# Patient Record
Sex: Female | Born: 1950 | Race: Black or African American | Hispanic: No | Marital: Married | Smoking: Former smoker
Health system: Southern US, Community
[De-identification: ages and names within clinical notes are randomized; demographics above are authoritative.]

## PROBLEM LIST (undated history)

## (undated) DIAGNOSIS — I10 Essential (primary) hypertension: Secondary | ICD-10-CM

## (undated) DIAGNOSIS — M1990 Unspecified osteoarthritis, unspecified site: Secondary | ICD-10-CM

## (undated) DIAGNOSIS — F32A Depression, unspecified: Secondary | ICD-10-CM

## (undated) DIAGNOSIS — E785 Hyperlipidemia, unspecified: Secondary | ICD-10-CM

## (undated) DIAGNOSIS — F419 Anxiety disorder, unspecified: Secondary | ICD-10-CM

## (undated) DIAGNOSIS — F329 Major depressive disorder, single episode, unspecified: Secondary | ICD-10-CM

## (undated) DIAGNOSIS — K219 Gastro-esophageal reflux disease without esophagitis: Secondary | ICD-10-CM

## (undated) DIAGNOSIS — E119 Type 2 diabetes mellitus without complications: Secondary | ICD-10-CM

## (undated) HISTORY — PX: OTHER SURGICAL HISTORY: SHX169

## (undated) HISTORY — PX: BREAST SURGERY: SHX581

## (undated) HISTORY — DX: Gastro-esophageal reflux disease without esophagitis: K21.9

## (undated) HISTORY — DX: Type 2 diabetes mellitus without complications: E11.9

## (undated) HISTORY — DX: Hyperlipidemia, unspecified: E78.5

## (undated) HISTORY — DX: Essential (primary) hypertension: I10

## (undated) HISTORY — PX: BILATERAL CARPAL TUNNEL RELEASE: SHX6508

## (undated) HISTORY — PX: JOINT REPLACEMENT: SHX530

---

## 2008-07-10 HISTORY — PX: CARDIAC CATHETERIZATION: SHX172

## 2008-10-15 ENCOUNTER — Ambulatory Visit: Payer: Self-pay | Admitting: Cardiology

## 2008-10-19 ENCOUNTER — Ambulatory Visit: Payer: Self-pay | Admitting: Cardiology

## 2008-11-06 ENCOUNTER — Inpatient Hospital Stay (HOSPITAL_BASED_OUTPATIENT_CLINIC_OR_DEPARTMENT_OTHER): Admission: RE | Admit: 2008-11-06 | Discharge: 2008-11-06 | Payer: Self-pay | Admitting: Cardiology

## 2008-11-06 ENCOUNTER — Ambulatory Visit: Payer: Self-pay | Admitting: Cardiology

## 2008-11-27 ENCOUNTER — Encounter (INDEPENDENT_AMBULATORY_CARE_PROVIDER_SITE_OTHER): Payer: Self-pay | Admitting: *Deleted

## 2009-04-06 DIAGNOSIS — R9439 Abnormal result of other cardiovascular function study: Secondary | ICD-10-CM | POA: Insufficient documentation

## 2009-04-06 DIAGNOSIS — I10 Essential (primary) hypertension: Secondary | ICD-10-CM | POA: Insufficient documentation

## 2009-04-06 DIAGNOSIS — E7800 Pure hypercholesterolemia, unspecified: Secondary | ICD-10-CM | POA: Insufficient documentation

## 2010-06-20 ENCOUNTER — Ambulatory Visit: Payer: Self-pay | Admitting: Internal Medicine

## 2010-11-22 NOTE — Assessment & Plan Note (Signed)
[facility]                          [facility] OFFICE NOTE   ELTA, ANGELL                   MRN:          [id number]  DATE:10/19/2008                            DOB:          [birth year]    Ms. Graefe has diabetes and hypertension.  She was referred for a  stress Cardiolite scan.  This was done at [facility] on October 15, 2008.  I  personally read that study.  The patient had decreased activity in the  mid anterior wall and the distal anterior wall with stress.  It appeared  to reverse.  There was some motion with the stress study.  The patient  has breast attenuation.  There was a definite anterior defect.  It is  compatible with ischemia.  It is possible that it could be a false-  positive related to breast attenuation and of motion.  However, I cannot  rule out moderate anterior ischemia from the study.   After seeing this result we were in touch with the patient and Dr.  Pauletta Browns office to arrange for early follow-up and she is seen today in  the office.  As mentioned, she does have diabetes.  She does have a  smothering sensation at times, but it is not necessarily related to  exertion.  She has had some palpitations.  There is no syncope or  presyncope.  There is a family history for coronary disease.  As  mentioned, she does have hypertension and diabetes.  She does not smoke.  She is overweight.   PAST MEDICAL HISTORY:   ALLERGIES:  PENICILLIN.   MEDICATIONS:  1. Avandamet.  2. Lovastatin.  3. Nabumetone.  4. Diovan/hydrochlorothiazide.  5. Aspirin.  6. Calcium.  7. Zinc.  8. Fish oil.  9. Magnesium.  10.Other vitamins.   OTHER MEDICAL PROBLEMS:  See the list below.   SOCIAL HISTORY:  The patient is married and she works at home.  She has  never smoked.   FAMILY HISTORY:  There is a strong family history of coronary disease.   REVIEW OF SYSTEMS:  The patient has no fevers or chills.  She has no  skin rashes.   There are no headaches.  There is no change in her vision  or hearing.  She has no cough.  There is rare shooting chest pain.  She  has no GI or GU symptoms.  There is no swelling.  She has no  musculoskeletal complaints.  All other systems are reviewed and are  negative.   PHYSICAL EXAMINATION:  Blood pressure today is 138/77, with a pulse of  74.  The patient is oriented to person, time and place.  Affect is normal.  HEENT:  Reveals no xanthelasma.  She has normal extraocular motion.  There are no carotid bruits.  There is no jugular venous distention.  LUNGS:  Clear.  Respiratory effort is not labored.  CARDIAC EXAM:  Reveals an S1 with an S2.  There are no clicks or  significant murmurs.  ABDOMEN:  Soft.  She has no significant peripheral edema.  There are no musculoskeletal deformities.  The patient  is overweight.   PROBLEMS INCLUDE:  1. History of bilateral carpal tunnel.  2. Status post left knee replacement.  3. Arthritis.  4. History of lumbar disk disease.  5. Hypertension.  6. Hypercholesterolemia.  7. Diabetes.  8. Abnormal stress Cardiolite scan.   The patient does have multiple risk factors for coronary disease.  I am  worried about the possibility of moderate anterior ischemia in this  diabetic patient.  I have had a long discussion with the patient and her  husband in person.  I believe that proceeding with cardiac  catheterization will be the most appropriate approach.  They are  considering this.  She is on aspirin at this time.  She needs to  continue on her cholesterol medication and of course try to lose weight.  The patient and her husband are in the process of deciding if they are  willing to proceed with catheterization.     Luis Abed, MD, [facility]  Electronically Signed    JDK/MedQ  DD: 10/19/2008  DT: 10/19/2008  Job #: [id number]   cc:   Ernestine Conrad, MD

## 2010-11-22 NOTE — Cardiovascular Report (Signed)
NAMEJIMMA, Kristen Gallagher NO.:  [id number]   MEDICAL RECORD NO.:  [id number]          PATIENT TYPE:  OIB   LOCATION:  1963                         FACILITY:  [facility]   PHYSICIAN:  Rollene Rotunda, MD, FACCDATE OF BIRTH:  02/22/1951   DATE OF PROCEDURE:  11/06/2008  DATE OF DISCHARGE:                            CARDIAC CATHETERIZATION   PRIMARY CARE PHYSICIAN:  Ernestine Conrad, MD   CARDIOLOGIST:  Luis Abed, MD, [facility].   PROCEDURE:  Left heart catheterization/coronary arteriography.   INDICATIONS:  Evaluate the patient with chest pain and a stress  perfusion study suggesting anterior apical ischemia.   PROCEDURE NOTE:  Left heart catheterization performed via the right  femoral artery.  The artery was cannulated using wall puncture.  A #4  French arterial sheath was inserted via the modified Seldinger  technique.  Preformed Judkins and a pigtail catheter were utilized.  The  patient tolerated procedure well and left the lab in stable condition.   RESULTS:  Hemodynamics:  LV 131/19, AO 139/62.  Coronaries:  Left main is normal.  The LAD was had proximal long  calcification.  There was long proximal 25% stenosis.  First through  third diagonals were small and normal.  The circumflex in the AV groove  was normal.  There was a first obtuse marginal, which was large and  normal.  A posterolateral which was large and normal.  The right  coronary artery is a dominant vessel.  It was large.  There is long  proximal 25% stenosis.  The PDA was large and normal.  Left ventriculogram:  The left ventriculogram was obtained in the RAO  projection.  The EF was 65% with normal wall motion.   CONCLUSION:  Nonobstructive coronary plaque.  Well-preserved ejection  fraction.   PLAN:  The patient will have continued medical management risk  reduction.      Rollene Rotunda, MD, [facility]  Electronically Signed     JH/MEDQ  D:  11/06/2008  T:  11/06/2008  Job:  [id number]   cc:    Luis Abed, MD, [facility]  Ernestine Conrad, MD

## 2013-01-23 DIAGNOSIS — R0602 Shortness of breath: Secondary | ICD-10-CM

## 2014-09-09 ENCOUNTER — Other Ambulatory Visit: Payer: Self-pay | Admitting: Family Medicine

## 2014-09-09 DIAGNOSIS — R921 Mammographic calcification found on diagnostic imaging of breast: Secondary | ICD-10-CM

## 2014-10-02 ENCOUNTER — Telehealth: Payer: Self-pay | Admitting: Cardiology

## 2014-10-02 ENCOUNTER — Other Ambulatory Visit: Payer: Self-pay | Admitting: *Deleted

## 2014-10-02 ENCOUNTER — Encounter: Payer: Self-pay | Admitting: *Deleted

## 2014-10-02 ENCOUNTER — Ambulatory Visit (INDEPENDENT_AMBULATORY_CARE_PROVIDER_SITE_OTHER): Payer: Medicare Other | Admitting: Cardiology

## 2014-10-02 VITALS — BP 122/72 | HR 67 | Ht 65.0 in | Wt 181.0 lb

## 2014-10-02 DIAGNOSIS — R079 Chest pain, unspecified: Secondary | ICD-10-CM

## 2014-10-02 DIAGNOSIS — I10 Essential (primary) hypertension: Secondary | ICD-10-CM

## 2014-10-02 NOTE — Telephone Encounter (Signed)
[facility] at 10am

## 2014-10-02 NOTE — Telephone Encounter (Signed)
No precert required for GXT °

## 2014-10-02 NOTE — Patient Instructions (Signed)
Your physician recommends that you schedule a follow-up appointment TO BE DETERMINED AFTER STRESS TEST  Your physician has requested that you have an exercise tolerance test. For further information please visit [email or web address]. Please also follow instruction sheet, as given.  Your physician recommends that you continue on your current medications as directed. Please refer to the Current Medication list given to you today.  Thank you for choosing [facility]!!

## 2014-10-02 NOTE — Progress Notes (Signed)
Clinical Summary Kristen Gallagher is a 64 y.o.female seen today as a new patient for chest pain  1. Chest pain - MPI in 2010 that was abnormal with anterior defect. Sent for cath, showed non-obstructive disease - notes some exertional chest pain recently. Heaviness in chest, fairly mild 1/10. Can walk 3 miles daily. Most comfortable walking on level ground, typically can have symptoms going up hill. Can have similar symptoms with anxiety. Last episode 1 week ago.   CAD risk factors: DM2, HTN, HL, brother MI mid 86s    Past Medical History  Diagnosis Date  . Type 2 diabetes mellitus   . Hypertension   . Hyperlipidemia      Allergies  Allergen Reactions  . Sulfa Antibiotics   . Amitiza [Lubiprostone] Nausea Only  . Azithromycin Rash  . Invokana [Canagliflozin] Other (See Comments)    Yeast infection/concern for bladder CA  . Penicillins Itching  . Prednisone Other (See Comments)    ELEVATE SUGAR LEVELS     Current Outpatient Prescriptions  Medication Sig Dispense Refill  . fluticasone (FLONASE) 50 MCG/ACT nasal spray Place 2 sprays into both nostrils daily.    Marland Kitchen glimepiride (AMARYL) 1 MG tablet Take 1 tablet by mouth daily.    . metFORMIN (GLUCOPHAGE-XR) 500 MG 24 hr tablet Take 3 tablets by mouth daily.    . sertraline (ZOLOFT) 25 MG tablet Take 1 tablet by mouth daily.  5   No current facility-administered medications for this visit.     Past Surgical History  Procedure Laterality Date  . Cardiac catheterization  2010     Allergies  Allergen Reactions  . Sulfa Antibiotics   . Amitiza [Lubiprostone] Nausea Only  . Azithromycin Rash  . Invokana [Canagliflozin] Other (See Comments)    Yeast infection/concern for bladder CA  . Penicillins Itching  . Prednisone Other (See Comments)    ELEVATE SUGAR LEVELS      Family History  Problem Relation Age of Onset  . Other      family history unknown in father  . Other      family history unknown in mother      Social History Ms. Delagarza has no tobacco history on file. Ms. Tidd has no alcohol history on file.   Review of Systems CONSTITUTIONAL: No weight loss, fever, chills, weakness or fatigue.  HEENT: Eyes: No visual loss, blurred vision, double vision or yellow sclerae.No hearing loss, sneezing, congestion, runny nose or sore throat.  SKIN: No rash or itching.  CARDIOVASCULAR: per HPI RESPIRATORY: No shortness of breath, cough or sputum.  GASTROINTESTINAL: No anorexia, nausea, vomiting or diarrhea. No abdominal pain or blood.  GENITOURINARY: No burning on urination, no polyuria NEUROLOGICAL: No headache, dizziness, syncope, paralysis, ataxia, numbness or tingling in the extremities. No change in bowel or bladder control.  MUSCULOSKELETAL: No muscle, back pain, joint pain or stiffness.  LYMPHATICS: No enlarged nodes. No history of splenectomy.  PSYCHIATRIC: No history of depression or anxiety.  ENDOCRINOLOGIC: No reports of sweating, cold or heat intolerance. No polyuria or polydipsia.  Marland Kitchen   Physical Examination p 67 bp 122/72 Wt 181 lbs BMI 30 Gen: resting comfortably, no acute distress HEENT: no scleral icterus, pupils equal round and reactive, no palptable cervical adenopathy,  CV: RRR, no m/r/g, no JVD, no carotid bruits Resp: Clear to auscultation bilaterally GI: abdomen is soft, non-tender, non-distended, normal bowel sounds, no hepatosplenomegaly MSK: extremities are warm, no edema.  Skin: warm, no rash Neuro:  no  focal deficits Psych: appropriate affect   Diagnostic Studies 10/2008 cath RESULTS: Hemodynamics: LV 131/19, AO 139/62. Coronaries: Left main is normal. The LAD was had proximal long calcification. There was long proximal 25% stenosis. First through third diagonals were small and normal. The circumflex in the AV groove was normal. There was a first obtuse marginal, which was large and normal. A posterolateral which was large and normal.  The right coronary artery is a dominant vessel. It was large. There is long proximal 25% stenosis. The PDA was large and normal. Left ventriculogram: The left ventriculogram was obtained in the RAO projection. The EF was 65% with normal wall motion.  CONCLUSION: Nonobstructive coronary plaque. Well-preserved ejection fraction.  PLAN: The patient will have continued medical management risk reduction.    Assessment and Plan  1. Chest pain - unclear etiology. Symptoms are mild in character however are primarily exertional. She has multiple CAD risk factors including DM2. Obtain GXT to further evaluate and risk strategy    F/u pending stress test results   Antoine PocheJonathan F. Magon Croson, M.D.

## 2014-10-09 ENCOUNTER — Ambulatory Visit
Admission: RE | Admit: 2014-10-09 | Discharge: 2014-10-09 | Disposition: A | Discharge status: Home / Self Care | Payer: Medicare Other | Source: Ambulatory Visit | Attending: Family Medicine | Admitting: Family Medicine

## 2014-10-09 DIAGNOSIS — R921 Mammographic calcification found on diagnostic imaging of breast: Secondary | ICD-10-CM

## 2014-10-16 ENCOUNTER — Ambulatory Visit (HOSPITAL_COMMUNITY)
Admission: RE | Admit: 2014-10-16 | Discharge: 2014-10-16 | Disposition: A | Discharge status: Home / Self Care | Payer: Medicare Other | Source: Ambulatory Visit | Attending: Cardiology | Admitting: Cardiology

## 2014-10-16 ENCOUNTER — Encounter (HOSPITAL_COMMUNITY): Payer: Self-pay

## 2014-10-16 DIAGNOSIS — R079 Chest pain, unspecified: Secondary | ICD-10-CM | POA: Diagnosis not present

## 2014-10-16 NOTE — Progress Notes (Addendum)
Stress Lab Nurses Notes - Kristen Gallagher  Kristen Gallagher 10/16/2014 Reason for doing test: Chest Pain Type of test: Regular GTX Nurse performing test: Kristen PoissonPhyllis Billingsly, RN Nuclear Medicine Tech: Not Applicable Echo Tech: Not Applicable MD performing test: Koneswaran/K.Lyman BishopLawrence NP Family MD: Bluth Test explained and consent signed: Yes.   IV started: No IV started Symptoms: Fatigue in legs  Treatment/Intervention: None Reason test stopped: fatigue After recovery IV was: NA Patient to return to Nuc. Med at : NA Patient discharged: Home Patient's Condition upon discharge was: stable Comments: During test peak BP 174/52 & HR 141.  Recovery BP 142/72 & HR 85.  Symptoms resolved in recovery. Kristen Gallagher, Kristen Gallagher   Patient exercised according to the Bruce protocol for 4 min 54 seconds achieving 7.00METs. Resting heart rate increased from 69 bpm to 141 bpm (90% of THR), and resting blood pressure increased from 141/73 to 174/52. The test was stopped due to fatigue, the patient did not experience any chest pain. Baseline EKG showed NSR. Stress EKG interpretation is affected by artifact. No evidence ischemic changes and no significant arrhytmias.   1. Negative exercise stress test for ischemia 2. Duke treadmill score of 5, consistent with low risk for major cardiac events 3. Good exercise functional capacity (110% of predicted based on age and gender).   Kristen RichJonathan Sweta Halseth MD

## 2014-10-20 ENCOUNTER — Telehealth: Payer: Self-pay | Admitting: *Deleted

## 2014-10-20 NOTE — Telephone Encounter (Signed)
Pt made aware of results of GXT. Forwarded to Dr. Loney HeringBluth. Pt stated she has appt with Dr. Loney HeringBluth in 3 months

## 2014-10-29 ENCOUNTER — Other Ambulatory Visit: Payer: Self-pay | Admitting: General Surgery

## 2014-10-29 DIAGNOSIS — N6091 Unspecified benign mammary dysplasia of right breast: Secondary | ICD-10-CM

## 2014-11-03 ENCOUNTER — Other Ambulatory Visit: Payer: Self-pay | Admitting: General Surgery

## 2014-11-03 DIAGNOSIS — N6091 Unspecified benign mammary dysplasia of right breast: Secondary | ICD-10-CM

## 2014-11-18 ENCOUNTER — Ambulatory Visit
Admission: RE | Admit: 2014-11-18 | Discharge: 2014-11-18 | Disposition: A | Discharge status: Home / Self Care | Payer: Medicare Other | Source: Ambulatory Visit | Attending: General Surgery | Admitting: General Surgery

## 2014-11-18 ENCOUNTER — Encounter (HOSPITAL_BASED_OUTPATIENT_CLINIC_OR_DEPARTMENT_OTHER): Payer: Self-pay | Admitting: *Deleted

## 2014-11-18 ENCOUNTER — Encounter (HOSPITAL_BASED_OUTPATIENT_CLINIC_OR_DEPARTMENT_OTHER)
Admission: RE | Admit: 2014-11-18 | Discharge: 2014-11-18 | Disposition: A | Discharge status: Home / Self Care | Payer: Medicare Other | Source: Ambulatory Visit

## 2014-11-18 DIAGNOSIS — I10 Essential (primary) hypertension: Secondary | ICD-10-CM | POA: Diagnosis not present

## 2014-11-18 DIAGNOSIS — N6091 Unspecified benign mammary dysplasia of right breast: Secondary | ICD-10-CM | POA: Diagnosis present

## 2014-11-18 DIAGNOSIS — Z87891 Personal history of nicotine dependence: Secondary | ICD-10-CM | POA: Diagnosis not present

## 2014-11-18 DIAGNOSIS — Z7982 Long term (current) use of aspirin: Secondary | ICD-10-CM | POA: Diagnosis not present

## 2014-11-18 DIAGNOSIS — Z881 Allergy status to other antibiotic agents status: Secondary | ICD-10-CM | POA: Diagnosis not present

## 2014-11-18 DIAGNOSIS — F329 Major depressive disorder, single episode, unspecified: Secondary | ICD-10-CM | POA: Diagnosis not present

## 2014-11-18 DIAGNOSIS — E785 Hyperlipidemia, unspecified: Secondary | ICD-10-CM | POA: Diagnosis not present

## 2014-11-18 DIAGNOSIS — E780 Pure hypercholesterolemia: Secondary | ICD-10-CM | POA: Diagnosis not present

## 2014-11-18 DIAGNOSIS — Z880 Allergy status to penicillin: Secondary | ICD-10-CM | POA: Diagnosis not present

## 2014-11-18 DIAGNOSIS — Z9861 Coronary angioplasty status: Secondary | ICD-10-CM | POA: Diagnosis not present

## 2014-11-18 DIAGNOSIS — Z882 Allergy status to sulfonamides status: Secondary | ICD-10-CM | POA: Diagnosis not present

## 2014-11-18 DIAGNOSIS — M1990 Unspecified osteoarthritis, unspecified site: Secondary | ICD-10-CM | POA: Diagnosis not present

## 2014-11-18 DIAGNOSIS — F419 Anxiety disorder, unspecified: Secondary | ICD-10-CM | POA: Diagnosis not present

## 2014-11-18 DIAGNOSIS — Z888 Allergy status to other drugs, medicaments and biological substances status: Secondary | ICD-10-CM | POA: Diagnosis not present

## 2014-11-18 DIAGNOSIS — E119 Type 2 diabetes mellitus without complications: Secondary | ICD-10-CM | POA: Diagnosis not present

## 2014-11-18 LAB — BASIC METABOLIC PANEL
Anion gap: 8 (ref 5–15)
BUN: 7 mg/dL (ref 6–20)
CHLORIDE: 98 mmol/L — AB (ref 101–111)
CO2: 28 mmol/L (ref 22–32)
Calcium: 9.7 mg/dL (ref 8.9–10.3)
Creatinine, Ser: 0.68 mg/dL (ref 0.44–1.00)
GFR calc Af Amer: >60 mL/min (ref >60)
GFR calc non Af Amer: >60 mL/min (ref >60)
Glucose, Bld: 85 mg/dL (ref 70–99)
Potassium: 4.3 mmol/L (ref 3.5–5.1)
SODIUM: 134 mmol/L — AB (ref 135–145)

## 2014-11-18 NOTE — Progress Notes (Signed)
[facility] so coming for BM[facility] before procedure today. Bring all medications.

## 2014-11-20 ENCOUNTER — Ambulatory Visit (HOSPITAL_BASED_OUTPATIENT_CLINIC_OR_DEPARTMENT_OTHER): Payer: Medicare Other | Admitting: Certified Registered"

## 2014-11-20 ENCOUNTER — Ambulatory Visit (HOSPITAL_BASED_OUTPATIENT_CLINIC_OR_DEPARTMENT_OTHER)
Admission: RE | Admit: 2014-11-20 | Discharge: 2014-11-20 | Disposition: A | Discharge status: Home / Self Care | Payer: Medicare Other | Source: Ambulatory Visit | Attending: General Surgery | Admitting: General Surgery

## 2014-11-20 ENCOUNTER — Encounter (HOSPITAL_BASED_OUTPATIENT_CLINIC_OR_DEPARTMENT_OTHER)
Admission: RE | Disposition: A | Discharge status: Home / Self Care | Payer: Self-pay | Source: Ambulatory Visit | Attending: General Surgery

## 2014-11-20 ENCOUNTER — Encounter (HOSPITAL_BASED_OUTPATIENT_CLINIC_OR_DEPARTMENT_OTHER): Payer: Self-pay | Admitting: *Deleted

## 2014-11-20 ENCOUNTER — Ambulatory Visit
Admission: RE | Admit: 2014-11-20 | Discharge: 2014-11-20 | Disposition: A | Discharge status: Home / Self Care | Payer: Medicare Other | Source: Ambulatory Visit | Attending: General Surgery | Admitting: General Surgery

## 2014-11-20 DIAGNOSIS — Z9861 Coronary angioplasty status: Secondary | ICD-10-CM | POA: Insufficient documentation

## 2014-11-20 DIAGNOSIS — F329 Major depressive disorder, single episode, unspecified: Secondary | ICD-10-CM | POA: Diagnosis not present

## 2014-11-20 DIAGNOSIS — I10 Essential (primary) hypertension: Secondary | ICD-10-CM | POA: Diagnosis not present

## 2014-11-20 DIAGNOSIS — M1990 Unspecified osteoarthritis, unspecified site: Secondary | ICD-10-CM | POA: Insufficient documentation

## 2014-11-20 DIAGNOSIS — E119 Type 2 diabetes mellitus without complications: Secondary | ICD-10-CM | POA: Insufficient documentation

## 2014-11-20 DIAGNOSIS — E780 Pure hypercholesterolemia: Secondary | ICD-10-CM | POA: Insufficient documentation

## 2014-11-20 DIAGNOSIS — Z881 Allergy status to other antibiotic agents status: Secondary | ICD-10-CM | POA: Insufficient documentation

## 2014-11-20 DIAGNOSIS — Z7982 Long term (current) use of aspirin: Secondary | ICD-10-CM | POA: Insufficient documentation

## 2014-11-20 DIAGNOSIS — N6091 Unspecified benign mammary dysplasia of right breast: Secondary | ICD-10-CM | POA: Diagnosis not present

## 2014-11-20 DIAGNOSIS — F419 Anxiety disorder, unspecified: Secondary | ICD-10-CM | POA: Diagnosis not present

## 2014-11-20 DIAGNOSIS — Z880 Allergy status to penicillin: Secondary | ICD-10-CM | POA: Insufficient documentation

## 2014-11-20 DIAGNOSIS — E785 Hyperlipidemia, unspecified: Secondary | ICD-10-CM | POA: Insufficient documentation

## 2014-11-20 DIAGNOSIS — Z87891 Personal history of nicotine dependence: Secondary | ICD-10-CM | POA: Insufficient documentation

## 2014-11-20 DIAGNOSIS — Z888 Allergy status to other drugs, medicaments and biological substances status: Secondary | ICD-10-CM | POA: Insufficient documentation

## 2014-11-20 DIAGNOSIS — Z882 Allergy status to sulfonamides status: Secondary | ICD-10-CM | POA: Insufficient documentation

## 2014-11-20 HISTORY — DX: Anxiety disorder, unspecified: F41.9

## 2014-11-20 HISTORY — PX: BREAST LUMPECTOMY WITH RADIOACTIVE SEED LOCALIZATION: SHX6424

## 2014-11-20 HISTORY — DX: Unspecified osteoarthritis, unspecified site: M19.90

## 2014-11-20 HISTORY — DX: Depression, unspecified: F32.A

## 2014-11-20 HISTORY — DX: Major depressive disorder, single episode, unspecified: F32.9

## 2014-11-20 LAB — GLUCOSE, CAPILLARY
Glucose-Capillary: 161 mg/dL — ABNORMAL HIGH (ref 65–99)
Glucose-Capillary: 216 mg/dL — ABNORMAL HIGH (ref 65–99)

## 2014-11-20 LAB — POCT HEMOGLOBIN-HEMACUE: HEMOGLOBIN: 12 g/dL (ref 12.0–15.0)

## 2014-11-20 SURGERY — BREAST LUMPECTOMY WITH RADIOACTIVE SEED LOCALIZATION
Anesthesia: General | Site: Breast | Laterality: Right

## 2014-11-20 MED ORDER — GLYCOPYRROLATE 0.2 MG/ML IJ SOLN: 0.2000 mg | Freq: Once | INTRAMUSCULAR | Status: DC | PRN

## 2014-11-20 MED ORDER — MIDAZOLAM HCL 2 MG/2ML IJ SOLN: 1.0000 mg | INTRAMUSCULAR | Status: DC | PRN

## 2014-11-20 MED ORDER — OXYCODONE HCL 5 MG PO TABS
5.0000 mg | ORAL_TABLET | Freq: Once | ORAL | Status: AC
  Administered 2014-11-20: 5 mg via ORAL

## 2014-11-20 MED ORDER — BUPIVACAINE-EPINEPHRINE (PF) 0.25% -1:200000 IJ SOLN
INTRAMUSCULAR | Status: AC
  Filled 2014-11-20: qty 30

## 2014-11-20 MED ORDER — ONDANSETRON HCL 4 MG/2ML IJ SOLN
INTRAMUSCULAR | Status: DC | PRN
  Administered 2014-11-20: 4 mg via INTRAVENOUS

## 2014-11-20 MED ORDER — CHLORHEXIDINE GLUCONATE 4 % EX LIQD: 1.0000 "application " | Freq: Once | CUTANEOUS | Status: DC

## 2014-11-20 MED ORDER — BUPIVACAINE HCL (PF) 0.25 % IJ SOLN
INTRAMUSCULAR | Status: AC
  Filled 2014-11-20: qty 30

## 2014-11-20 MED ORDER — ROPIVACAINE HCL 5 MG/ML IJ SOLN
INTRAMUSCULAR | Status: DC | PRN
  Administered 2014-11-20: 30 mL via PERINEURAL

## 2014-11-20 MED ORDER — FENTANYL CITRATE (PF) 100 MCG/2ML IJ SOLN: 50.0000 ug | INTRAMUSCULAR | Status: DC | PRN

## 2014-11-20 MED ORDER — DEXAMETHASONE SODIUM PHOSPHATE 4 MG/ML IJ SOLN
INTRAMUSCULAR | Status: DC | PRN
Start: 2014-11-20 — End: 2014-11-20
  Administered 2014-11-20: 4 mg via INTRAVENOUS

## 2014-11-20 MED ORDER — PROPOFOL 10 MG/ML IV BOLUS
INTRAVENOUS | Status: DC | PRN
  Administered 2014-11-20: 150 mg via INTRAVENOUS

## 2014-11-20 MED ORDER — FENTANYL CITRATE (PF) 100 MCG/2ML IJ SOLN
INTRAMUSCULAR | Status: AC
  Filled 2014-11-20: qty 2

## 2014-11-20 MED ORDER — LACTATED RINGERS IV SOLN
INTRAVENOUS | Status: DC
  Administered 2014-11-20: 07:00:00 via INTRAVENOUS

## 2014-11-20 MED ORDER — PROMETHAZINE HCL 25 MG/ML IJ SOLN
6.2500 mg | INTRAMUSCULAR | Status: DC | PRN
Start: 2014-11-20 — End: 2014-11-20

## 2014-11-20 MED ORDER — LIDOCAINE HCL (CARDIAC) 20 MG/ML IV SOLN
INTRAVENOUS | Status: DC | PRN
  Administered 2014-11-20: 60 mg via INTRAVENOUS

## 2014-11-20 MED ORDER — PROPOFOL 500 MG/50ML IV EMUL
INTRAVENOUS | Status: AC
  Filled 2014-11-20: qty 50

## 2014-11-20 MED ORDER — OXYCODONE HCL 5 MG PO TABS
ORAL_TABLET | ORAL | Status: AC
  Filled 2014-11-20: qty 1

## 2014-11-20 MED ORDER — BUPIVACAINE-EPINEPHRINE (PF) 0.25% -1:200000 IJ SOLN
INTRAMUSCULAR | Status: DC | PRN
  Administered 2014-11-20: 19 mL

## 2014-11-20 MED ORDER — FENTANYL CITRATE (PF) 100 MCG/2ML IJ SOLN
25.0000 ug | INTRAMUSCULAR | Status: DC | PRN
  Administered 2014-11-20: 25 ug via INTRAVENOUS

## 2014-11-20 MED ORDER — FENTANYL CITRATE (PF) 100 MCG/2ML IJ SOLN
INTRAMUSCULAR | Status: DC | PRN
  Administered 2014-11-20 (×2): 25 ug via INTRAVENOUS
  Administered 2014-11-20: 50 ug via INTRAVENOUS

## 2014-11-20 MED ORDER — KETOROLAC TROMETHAMINE 30 MG/ML IJ SOLN: 30.0000 mg | Freq: Once | INTRAMUSCULAR | Status: DC | PRN

## 2014-11-20 MED ORDER — FENTANYL CITRATE (PF) 100 MCG/2ML IJ SOLN
INTRAMUSCULAR | Status: AC
  Filled 2014-11-20: qty 6

## 2014-11-20 MED ORDER — VANCOMYCIN HCL IN DEXTROSE 1-5 GM/200ML-% IV SOLN
1000.0000 mg | INTRAVENOUS | Status: AC
  Administered 2014-11-20: 1000 mg via INTRAVENOUS

## 2014-11-20 MED ORDER — VANCOMYCIN HCL IN DEXTROSE 1-5 GM/200ML-% IV SOLN
INTRAVENOUS | Status: AC
  Filled 2014-11-20: qty 200

## 2014-11-20 MED ORDER — OXYCODONE-ACETAMINOPHEN 5-325 MG PO TABS: 1.0000 | ORAL_TABLET | ORAL | Status: DC | PRN

## 2014-11-20 SURGICAL SUPPLY — 40 items
APPLIER CLIP 9.375 MED OPEN (MISCELLANEOUS)
BLADE SURG 15 STRL LF DISP TIS (BLADE) ×1 IMPLANT
BLADE SURG 15 STRL SS (BLADE) ×1
CANISTER SUC SOCK COL 7IN (MISCELLANEOUS) IMPLANT
CANISTER SUCT 1200ML W/VALVE (MISCELLANEOUS) ×2 IMPLANT
CHLORAPREP W/TINT 26ML (MISCELLANEOUS) ×2 IMPLANT
CLIP APPLIE 9.375 MED OPEN (MISCELLANEOUS) IMPLANT
COVER BACK TABLE 60X90IN (DRAPES) ×2 IMPLANT
COVER MAYO STAND STRL (DRAPES) ×2 IMPLANT
COVER PROBE W GEL 5X96 (DRAPES) ×2 IMPLANT
DECANTER SPIKE VIAL GLASS SM (MISCELLANEOUS) IMPLANT
DEVICE DUBIN W/COMP PLATE 8390 (MISCELLANEOUS) ×2 IMPLANT
DRAPE LAPAROSCOPIC ABDOMINAL (DRAPES) ×2 IMPLANT
DRAPE UTILITY XL STRL (DRAPES) ×2 IMPLANT
ELECT COATED BLADE 2.86 ST (ELECTRODE) ×2 IMPLANT
ELECT REM PT RETURN 9FT ADLT (ELECTROSURGICAL) ×2
ELECTRODE REM PT RTRN 9FT ADLT (ELECTROSURGICAL) ×1 IMPLANT
GLOVE BIO SURGEON STRL SZ7.5 (GLOVE) ×2 IMPLANT
GLOVE BIOGEL PI IND STRL 7.0 (GLOVE) ×1 IMPLANT
GLOVE BIOGEL PI INDICATOR 7.0 (GLOVE) ×1
GLOVE ECLIPSE 6.5 STRL STRAW (GLOVE) ×2 IMPLANT
GLOVE EXAM NITRILE EXT CUFF MD (GLOVE) ×2 IMPLANT
GOWN STRL REUS W/ TWL LRG LVL3 (GOWN DISPOSABLE) ×2 IMPLANT
GOWN STRL REUS W/TWL LRG LVL3 (GOWN DISPOSABLE) ×2
KIT MARKER MARGIN INK (KITS) ×2 IMPLANT
LIQUID BAND (GAUZE/BANDAGES/DRESSINGS) ×2 IMPLANT
NEEDLE HYPO 25X1 1.5 SAFETY (NEEDLE) ×2 IMPLANT
NS IRRIG 1000ML POUR BTL (IV SOLUTION) ×2 IMPLANT
PACK BASIN DAY SURGERY FS (CUSTOM PROCEDURE TRAY) ×2 IMPLANT
PENCIL BUTTON HOLSTER BLD 10FT (ELECTRODE) ×2 IMPLANT
SLEEVE SCD COMPRESS KNEE MED (MISCELLANEOUS) ×2 IMPLANT
SPONGE LAP 18X18 X RAY DECT (DISPOSABLE) ×2 IMPLANT
SUT MON AB 4-0 PC3 18 (SUTURE) ×2 IMPLANT
SUT SILK 2 0 SH (SUTURE) IMPLANT
SUT VICRYL 3-0 CR8 SH (SUTURE) ×2 IMPLANT
SYR CONTROL 10ML LL (SYRINGE) ×2 IMPLANT
TOWEL OR 17X24 6PK STRL BLUE (TOWEL DISPOSABLE) ×2 IMPLANT
TOWEL OR NON WOVEN STRL DISP B (DISPOSABLE) IMPLANT
TUBE CONNECTING 20X1/4 (TUBING) ×2 IMPLANT
YANKAUER SUCT BULB TIP NO VENT (SUCTIONS) ×2 IMPLANT

## 2014-11-20 NOTE — Anesthesia Procedure Notes (Addendum)
Procedure Name: LMA Insertion Date/Time: 11/20/2014 7:34 AM Performed by: BLOCKER, TIMOTHY D Pre-anesthesia Checklist: Patient identified, Emergency Drugs available, Suction available and Patient being monitored Patient Re-evaluated:Patient Re-evaluated prior to inductionOxygen Delivery Method: Circle System Utilized Preoxygenation: Pre-oxygenation with 100% oxygen Intubation Type: IV induction Ventilation: Mask ventilation without difficulty LMA: LMA inserted LMA Size: 4.0 Number of attempts: 1 Airway Equipment and Method: Bite block Placement Confirmation: positive ETCO2 Tube secured with: Tape Dental Injury: Teeth and Oropharynx as per pre-operative assessment

## 2014-11-20 NOTE — Anesthesia Postprocedure Evaluation (Signed)
  Anesthesia Post-op Note  Patient: Kristen Gallagher  Procedure(s) Performed: Procedure(s) (LRB): BREAST LUMPECTOMY WITH RADIOACTIVE SEED LOCALIZATION (Right)  Patient Location: PACU  Anesthesia Type: General  Level of Consciousness: awake and alert   Airway and Oxygen Therapy: Patient Spontanous Breathing  Post-op Pain: mild  Post-op Assessment: Post-op Vital signs reviewed, Patient's Cardiovascular Status Stable, Respiratory Function Stable, Patent Airway and No signs of Nausea or vomiting  Last Vitals:  Filed Vitals:   11/20/14 0915  BP:   Pulse: 80  Temp:   Resp:     Post-op Vital Signs: stable   Complications: No apparent anesthesia complications

## 2014-11-20 NOTE — Discharge Instructions (Signed)

## 2014-11-20 NOTE — Op Note (Signed)
11/20/2014  8:32 AM  PATIENT:  Kristen Gallagher  64 y.o. female  PRE-OPERATIVE DIAGNOSIS:  Right Breast ALH  POST-OPERATIVE DIAGNOSIS:  Right breast atypical lobular hyperplasia  PROCEDURE:  Procedure(s): BREAST LUMPECTOMY WITH RADIOACTIVE SEED LOCALIZATION (Right)  SURGEON:  Surgeon(s) and Role:    * Griselda MinerPaul Toth III, MD - Primary  PHYSICIAN ASSISTANT:   ASSISTANTS: none   ANESTHESIA:   general  EBL:  Total I/O In: 700 [I.V.:700] Out: -   BLOOD ADMINISTERED:none  DRAINS: none   LOCAL MEDICATIONS USED:  MARCAINE     SPECIMEN:  Source of Specimen:  right breast tissue  DISPOSITION OF SPECIMEN:  PATHOLOGY  COUNTS:  YES  TOURNIQUET:  * No tourniquets in log *  DICTATION: .Dragon Dictation  After informed consent was obtained the patient was brought to the operating room and placed in the supine position on the operating room table. After adequate induction of general anesthesia the patient's right breast was prepped with ChloraPrep, allowed to dry, and draped in usual sterile manner. Previously a I-125 radioactive seed was placed in the upper outer quadrant of the right breast to mark an area of atypical lobular hyperplasia. The neoprobe device was used to identify the area of radioactivity. A curvilinear incision was made overlying the area. This incision was carried through the skin and subcutaneous tissue sharply with electrocautery. While checking the position of the radioactive seed frequently with the neoprobe a circular portion of breast tissue was excised sharply around the seed. Once the specimen was removed it was oriented with the appropriate paint colors. A specimen radiograph showed the seeds to be in the specimen. The clip was not identified although the radiologist felt that the clip had migrated away from the area and that the radioactive seed was marking the true spot. An additional superior margin was taken where it looked like we might see the clip but the clip  again was not identified. Hemostasis was achieved using the Bovie electrocautery. The specimen was sent to pathology for further evaluation. The wound was infiltrated with quarter percent Marcaine. The deep layer of the wound was closed with interrupted 3-0 Vicryl stitches. The skin was then closed with interrupted 4-0 Monocryl subcuticular stitches. Dermabond dressings were applied. The patient tolerated the procedure well. At the end of the case all needle sponge and instrument counts were correct. The patient was then awakened and taken to recovery in stable condition.  PLAN OF CARE: Discharge to home after PACU  PATIENT DISPOSITION:  PACU - hemodynamically stable.   Delay start of Pharmacological VTE agent (>24hrs) due to surgical blood loss or risk of bleeding: not applicable

## 2014-11-20 NOTE — Transfer of Care (Signed)
Immediate Anesthesia Transfer of Care Note  Patient: Kristen Gallagher  Procedure(s) Performed: Procedure(s): BREAST LUMPECTOMY WITH RADIOACTIVE SEED LOCALIZATION (Right)  Patient Location: PACU  Anesthesia Type:General  Level of Consciousness: awake and patient cooperative  Airway & Oxygen Therapy: Patient Spontanous Breathing and Patient connected to face mask oxygen  Post-op Assessment: Report given to RN and Post -op Vital signs reviewed and stable  Post vital signs: Reviewed and stable  Last Vitals:  Filed Vitals:   11/20/14 0619  BP: 150/78  Pulse: 73  Temp: 36.3 C  Resp: 16    Complications: No apparent anesthesia complications

## 2014-11-20 NOTE — Anesthesia Preprocedure Evaluation (Addendum)
Anesthesia Evaluation  Patient identified by MRN, date of birth, ID band Patient awake    Reviewed: Allergy & Precautions, NPO status , Patient's Chart, lab work & pertinent test results  Airway Mallampati: II  TM Distance: >3 FB Neck ROM: Full    Dental no notable dental hx.    Pulmonary neg pulmonary ROS, former smoker,  breath sounds clear to auscultation  Pulmonary exam normal       Cardiovascular hypertension, Pt. on medications Normal cardiovascular examRhythm:Regular Rate:Normal     Neuro/Psych PSYCHIATRIC DISORDERS Anxiety Depression negative neurological ROS     GI/Hepatic negative GI ROS, Neg liver ROS,   Endo/Other  diabetes, Type 2  Renal/GU negative Renal ROS  negative genitourinary   Musculoskeletal negative musculoskeletal ROS (+) Arthritis -,   Abdominal   Peds negative pediatric ROS (+)  Hematology negative hematology ROS (+)   Anesthesia Other Findings   Reproductive/Obstetrics negative OB ROS                            Anesthesia Physical Anesthesia Plan  ASA: II  Anesthesia Plan: General   Post-op Pain Management:    Induction: Intravenous  Airway Management Planned: LMA  Additional Equipment:   Intra-op Plan:   Post-operative Plan: Extubation in OR  Informed Consent: I have reviewed the patients History and Physical, chart, labs and discussed the procedure including the risks, benefits and alternatives for the proposed anesthesia with the patient or authorized representative who has indicated his/her understanding and acceptance.   Dental advisory given  Plan Discussed with: CRNA and Surgeon  Anesthesia Plan Comments:         Anesthesia Quick Evaluation

## 2014-11-20 NOTE — Interval H&P Note (Signed)
History and Physical Interval Note:  11/20/2014 7:11 AM  Kristen Gallagher  has presented today for surgery, with the diagnosis of Right Breast ALH  The various methods of treatment have been discussed with the patient and family. After consideration of risks, benefits and other options for treatment, the patient has consented to  Procedure(s): BREAST LUMPECTOMY WITH RADIOACTIVE SEED LOCALIZATION (Right) as a surgical intervention .  The patient's history has been reviewed, patient examined, no change in status, stable for surgery.  I have reviewed the patient's chart and labs.  Questions were answered to the patient's satisfaction.     TOTH III,Lannah Koike S

## 2014-11-20 NOTE — H&P (Signed)
Kristen Gallagher 10/29/2014 1:52 PM Location: [facility] Patient #: [id number] DOB: 03-22-1951 Married / Language: English / Race: Black or African American Female  History of Present Illness Kristen Gallagher(Laderrick Wilk S. Carolynne Edouardoth MD; 10/29/2014 2:29 PM) Patient words: breast mass.  The patient is a 64 year old female who presents for a follow-up for Breast mass. We are asked to see the patient in consultation by Dr. Amie Portlandavid Ormond to evaluate her for a right breast abnormality. The patient is a 64 year old black female who recently went for a routine screening mammogram. At that time she was found to have small clusters of abnormal calcifications in each breast. The left side was biopsied and came back as fibrocystic disease. The right side was biopsied and came back as atypical lobular hyperplasia. She has no complaints of pain in either breast. She denies any discharge from the nipple in either side. She has no personal or family history of breast cancer   Other Problems Kristen Gallagher(Michelle R Brooks, CMA; 10/29/2014 1:53 PM) Anxiety Disorder Arthritis Back Pain Diabetes Mellitus High blood pressure  Past Surgical History Kristen Gallagher(Michelle R Brooks, CMA; 10/29/2014 1:53 PM) Breast Biopsy Left. Foot Surgery Left. Knee Surgery Left.  Diagnostic Studies History Kristen Gallagher(Michelle R [place], [place]; 10/29/2014 1:53 PM) Colonoscopy 1-5 years ago Mammogram within last year  Allergies Kristen Gallagher(Michelle R Brooks, CMA; 10/29/2014 1:54 PM) Amitiza *GASTROINTESTINAL AGENTS - MISC.* Nausea. Azithromycin *CHEMICALS* Rash. Invokamet *ANTIDIABETICS* Penicillin G Potassium *PENICILLINS* Itching. PredniSONE (Pak) *CORTICOSTEROIDS* Sulfa 10 *OPHTHALMIC AGENTS* Rash.  Medication History Kristen Gallagher(Michelle R [place], [place]; 10/29/2014 1:55 PM) Sertraline HCl (50MG  Tablet, Oral) Active. MetFORMIN HCl ER (500MG  Tablet ER 24HR, Oral) Active. Lovastatin (40MG  Tablet, Oral) Active. Losartan Potassium (100MG  Tablet, Oral)  Active. Hydrochlorothiazide (25MG  Tablet, Oral) Active. ALPRAZolam (0.25MG  Tablet, Oral) Active.  Social History Kristen Gallagher(Michelle R [place], [place]; 10/29/2014 1:53 PM) Caffeine use Carbonated beverages, Coffee, Tea. Illicit drug use Recently quit drug use.  Family History Kristen Gallagher(Michelle R [place], [place]; 10/29/2014 1:53 PM) Arthritis Brother, Mother, Sister. Diabetes Mellitus Brother, Father, Mother, Sister. Heart Disease Brother, Father. Heart disease in female family member before age 65 Hypertension Brother, Father, Mother, Sister.  Pregnancy / Birth History Kristen Gallagher(Michelle R [place], [place]; 10/29/2014 1:53 PM) Age at menarche 11 years. Age of menopause 7451-55 Gravida 3 Maternal age 65-20 Para 3 Regular periods  Review of Systems Kristen Gallagher(Michelle R. Brooks CMA; 10/29/2014 1:53 PM) General Not Present- Appetite Loss, Chills, Fatigue, Fever, Night Sweats, Weight Gain and Weight Loss. Skin Present- Dryness. Not Present- Change in Wart/Mole, Hives, Jaundice, New Lesions, Non-Healing Wounds, Rash and Ulcer. HEENT Present- Seasonal Allergies and Wears glasses/contact lenses. Not Present- Earache, Hearing Loss, Hoarseness, Nose Bleed, Oral Ulcers, Ringing in the Ears, Sinus Pain, Sore Throat, Visual Disturbances and Yellow Eyes. Respiratory Not Present- Bloody sputum, Chronic Cough, Difficulty Breathing, Snoring and Wheezing. Breast Not Present- Breast Mass, Breast Pain, Nipple Discharge and Skin Changes. Cardiovascular Not Present- Chest Pain, Difficulty Breathing Lying Down, Leg Cramps, Palpitations, Rapid Heart Rate, Shortness of Breath and Swelling of Extremities. Gastrointestinal Present- Constipation. Not Present- Abdominal Pain, Bloating, Bloody Stool, Change in Bowel Habits, Chronic diarrhea, Difficulty Swallowing, Excessive gas, Gets full quickly at meals, Hemorrhoids, Indigestion, Nausea, Rectal Pain and Vomiting. Musculoskeletal Present- Joint Stiffness. Not Present- Back Pain, Joint Pain, Muscle Pain,  Muscle Weakness and Swelling of Extremities. Neurological Not Present- Decreased Memory, Fainting, Headaches, Numbness, Seizures, Tingling, Tremor, Trouble walking and Weakness. Psychiatric Present- Anxiety. Not Present- Bipolar, Change in Sleep Pattern, Depression, Fearful and Frequent crying.   [employer](Michelle R. Shon BatonBrooks  CMA; 10/29/2014 1:52 PM) 10/29/2014 1:52 PM Weight: 177 lb Height: 65in Body Surface Area: 1.92 m Body Mass Index: 29.45 kg/m BP: 136/78 (Sitting, Left Arm, Standard)    Physical Exam Renae Fickle(Claude Swendsen S. Carolynne Edouardoth MD; 10/29/2014 2:30 PM) General Mental Status-Alert. General Appearance-Consistent with stated age. Hydration-Well hydrated. Voice-Normal.  Head and Neck Head-normocephalic, atraumatic with no lesions or palpable masses. Trachea-midline. Thyroid Gland Characteristics - normal size and consistency.  Eye Eyeball - Bilateral-Extraocular movements intact. Sclera/Conjunctiva - Bilateral-No scleral icterus.  Chest and Lung Exam Chest and lung exam reveals -quiet, even and easy respiratory effort with no use of accessory muscles and on auscultation, normal breath sounds, no adventitious sounds and normal vocal resonance. Inspection Chest Wall - Normal. Back - normal.  Breast Note: There is no palpable mass in either breast. There is no palpable axillary, supraclavicular, or cervical lymphadenopathy   Cardiovascular Cardiovascular examination reveals -normal heart sounds, regular rate and rhythm with no murmurs and normal pedal pulses bilaterally.  Abdomen Inspection Inspection of the abdomen reveals - No Hernias. Skin - Scar - no surgical scars. Palpation/Percussion Palpation and Percussion of the abdomen reveal - Soft, Non Tender, No Rebound tenderness, No Rigidity (guarding) and No hepatosplenomegaly. Auscultation Auscultation of the abdomen reveals - Bowel sounds normal.  Neurologic Neurologic evaluation reveals -alert and  oriented x 3 with no impairment of recent or remote memory. Mental Status-Normal.  Musculoskeletal Normal Exam - Left-Upper Extremity Strength Normal and Lower Extremity Strength Normal. Normal Exam - Right-Upper Extremity Strength Normal and Lower Extremity Strength Normal.  Lymphatic Head & Neck  General Head & Neck Lymphatics: Bilateral - Description - Normal. Axillary  General Axillary Region: Bilateral - Description - Normal. Tenderness - Non Tender. Femoral & Inguinal  Generalized Femoral & Inguinal Lymphatics: Bilateral - Description - Normal. Tenderness - Non Tender.    Assessment & Plan Renae Fickle(Germain Koopmann S. Carolynne Edouardoth MD; 10/29/2014 2:16 PM) ATYPICAL LOBULAR HYPERPLASIA OF RIGHT BREAST (610.8  N60.91) Impression: The patient appears to have a small area of atypical lobular hyperplasia in the upper outer right breast. Because this is a high risk lesion and has an atypical appearance I have recommended that she have this area removed. She would also like to have this done. I have discussed with her in detail the risks and benefits of the operation to remove the area as well as some of the technical aspects and she understands and wishes to proceed. I will plan for a right breast radioactive seed localized lumpectomy     Signed by Caleen EssexPaul S Toth, MD (10/29/2014 2:30 PM)

## 2014-11-23 ENCOUNTER — Encounter (HOSPITAL_BASED_OUTPATIENT_CLINIC_OR_DEPARTMENT_OTHER): Payer: Self-pay | Admitting: General Surgery

## 2016-02-23 IMAGING — MG MM LT BREAST BX W LOC DEV 1ST LESION IMAGE BX SPEC STEREO GUIDE
1 series · 1 of 1 positions shown · non-contrast
Comparison: Previous exams.

ADDENDUM:
Pathology revealed fibroadenomatoid change with calcifications,
fibrocystic changes, and focal atypical lobular hyperplasia in the
right breast. Fibroadenomatoid change with calcifications and
fibrocystic changes were found in the left breast. This was found to
be concordant by Dr. Fatahia Breezy. Pathology was discussed with the
patient by telephone. She reported doing well after the biopsy. Post
biopsy instructions and care were reviewed and her questions were
answered. Surgical consultation has been arranged with Dr. Chee Boyzz
at [REDACTED] on October 29, 2014. My number
was provided for additional concerns or questions.

Pathology results reported by Blain Jumper RN, BSN on October 13, 2014.
CLINICAL DATA: Patient presents for stereotactic core needle biopsy
of left breast calcifications.
EXAM:
LEFT BREAST STEREOTACTIC CORE NEEDLE BIOPSY

[L SPECIMEN]
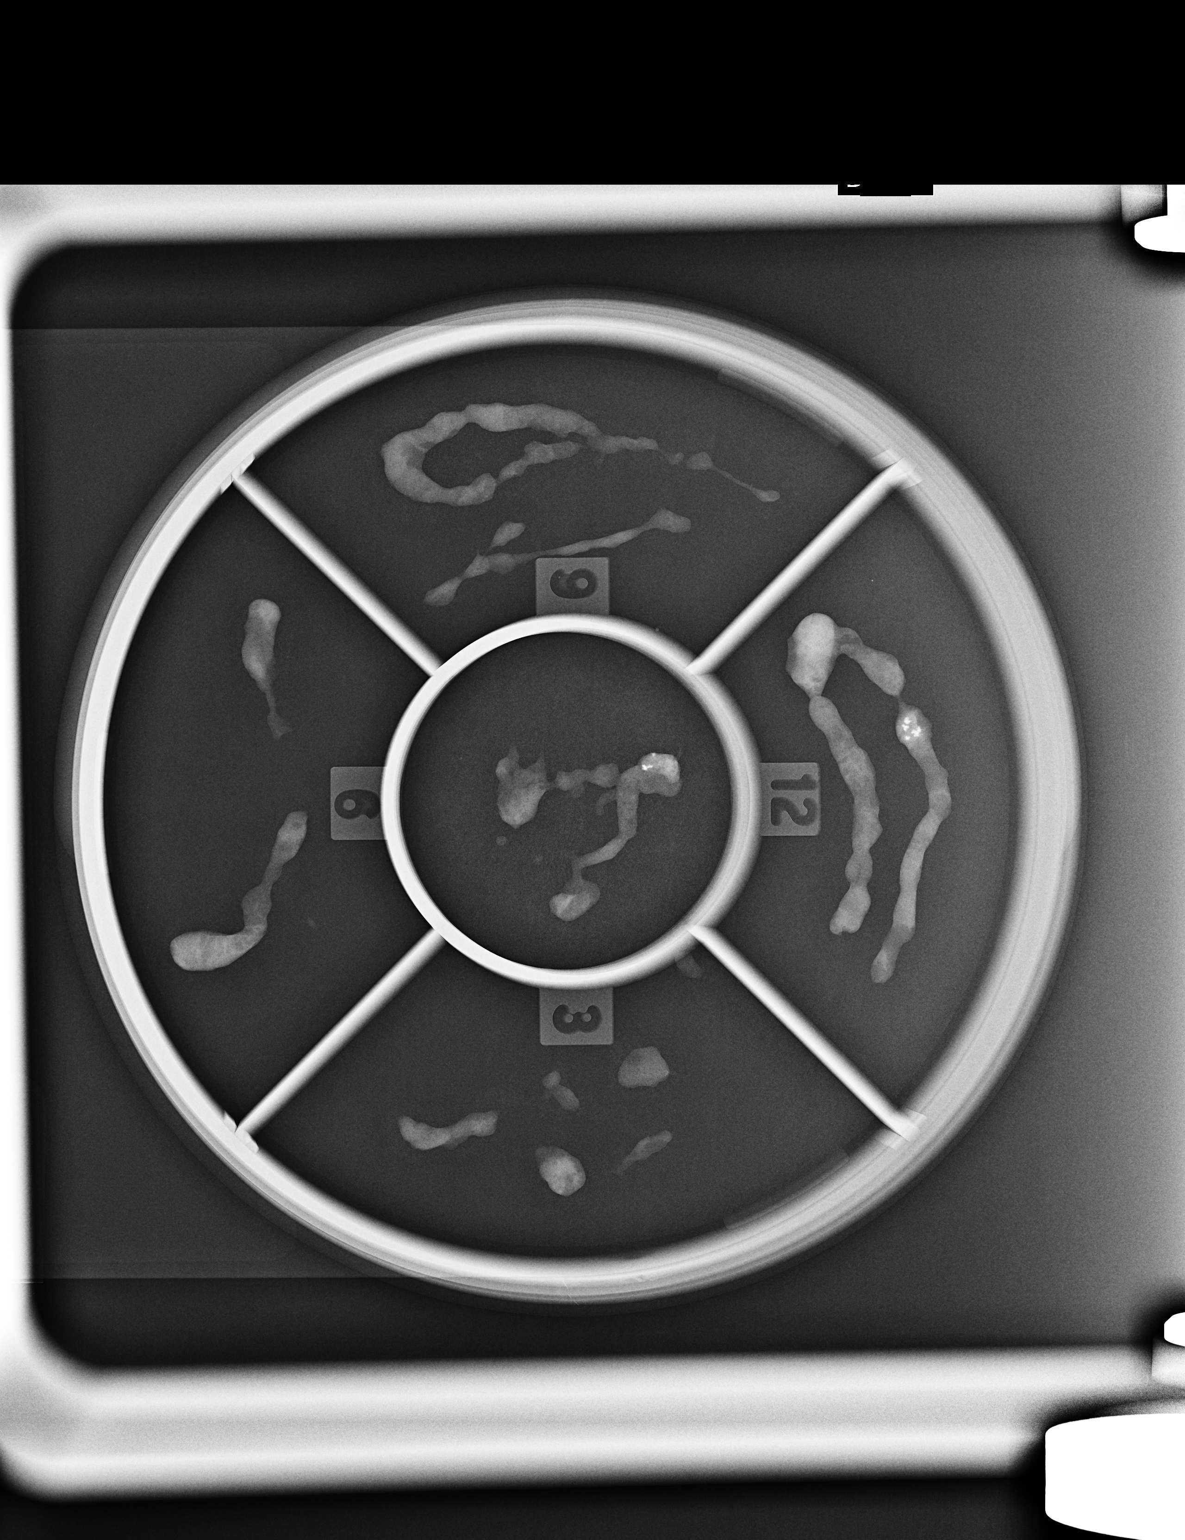

[1 of 1 positions shown; findings below may reference images not displayed]



Using sterile technique and 2% Lidocaine as local anesthetic, under
stereotactic guidance, a 9 gauge vacuum assist device was used to
perform core needle biopsy of calcifications in the lateral left
breast using a cranial to caudal approach. Specimen radiograph was
performed showing multiple calcifications for which biopsy was
performed. Specimens with calcifications are identified for
pathology.

At the conclusion of the procedure, a X shaped tissue marker clip
was deployed into the biopsy cavity. Follow-up 2-view mammogram was
performed and dictated separately.
IMPRESSION: Stereotactic-guided biopsy of left breast calcifications. No
apparent complications.

## 2016-04-03 IMAGING — MG MM RT RADIOACTIVE SEED LOC MAMMO GUIDE
8 of 9 series · 8 of 9 positions shown · non-contrast
Comparison: Previous exam(s).

CLINICAL DATA: Patient with recent diagnosis right breast lobular
neoplasia (atypical lobular hyperplasia).

EXAM:
MAMMOGRAPHIC GUIDED RADIOACTIVE SEED LOCALIZATION OF THE RIGHT
BREAST

[R CC (1 of 4)]
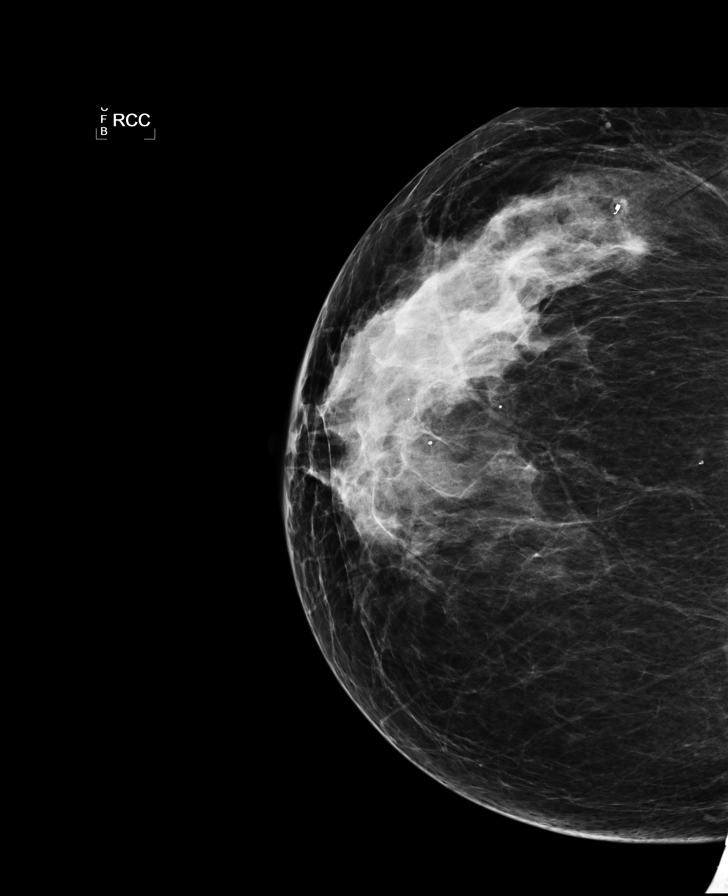

[R MLO (1 of 4)]
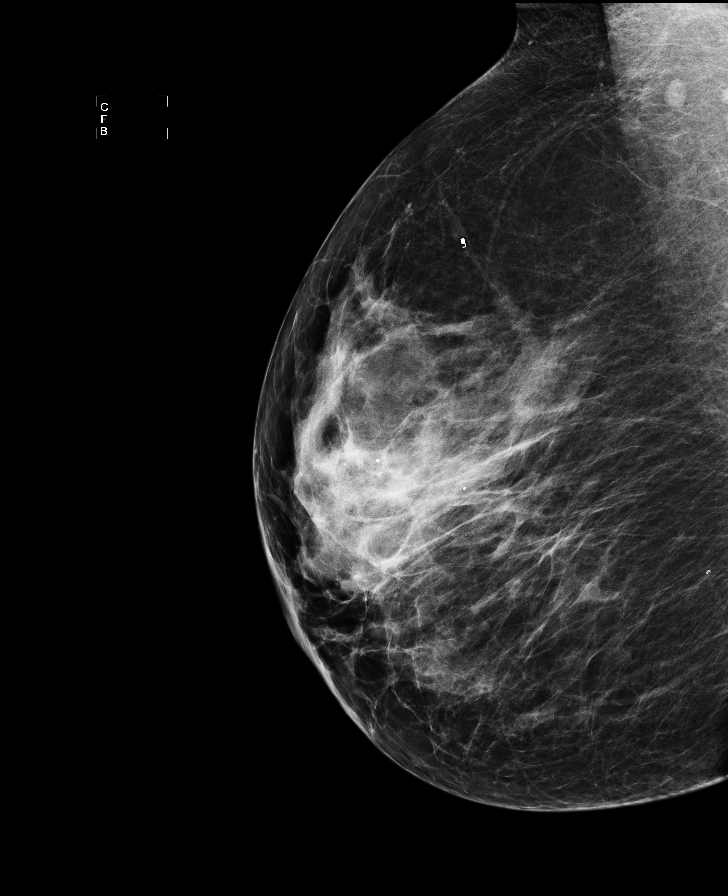

[R CC (2 of 4)]
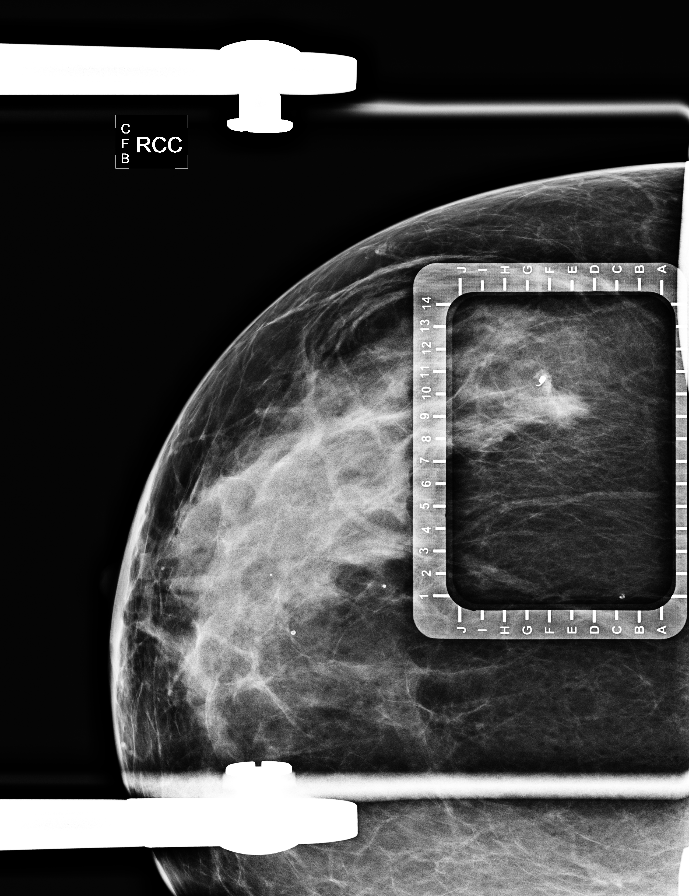

[R CC (3 of 4)]
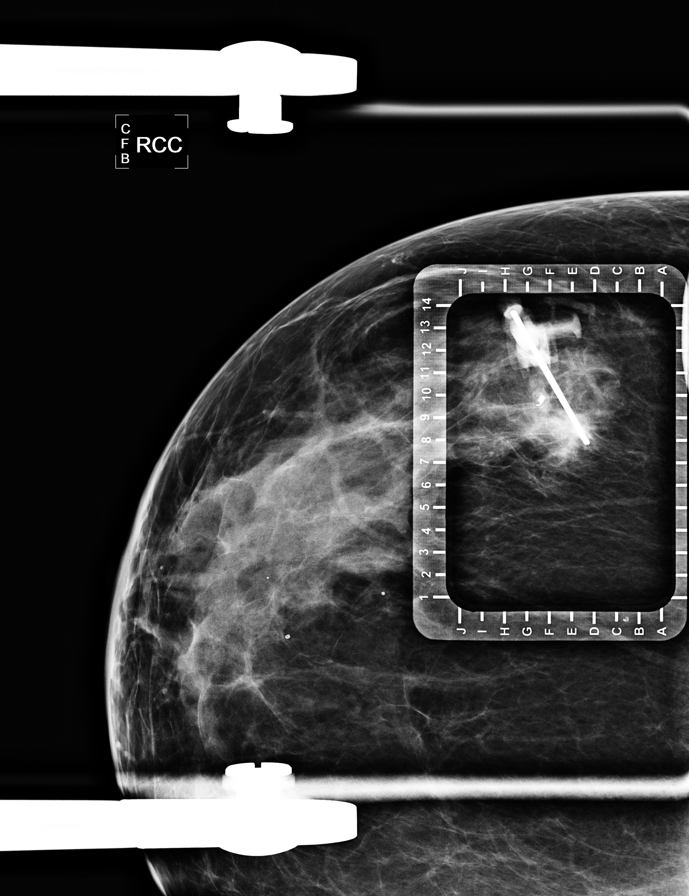

[R MLO (2 of 4)]
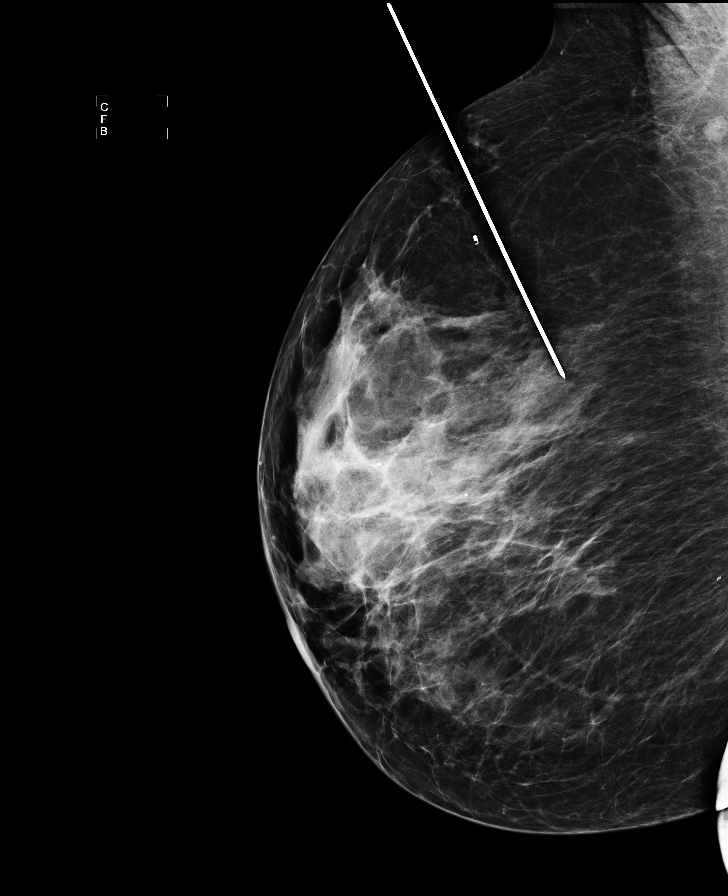

[R MLO (3 of 4)]
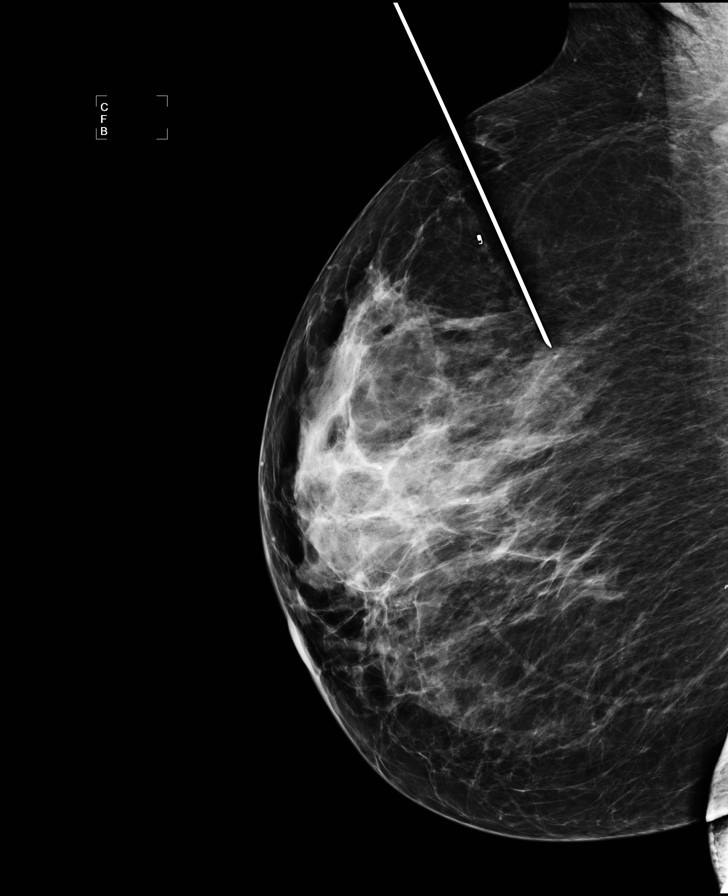

[R CC (4 of 4)]
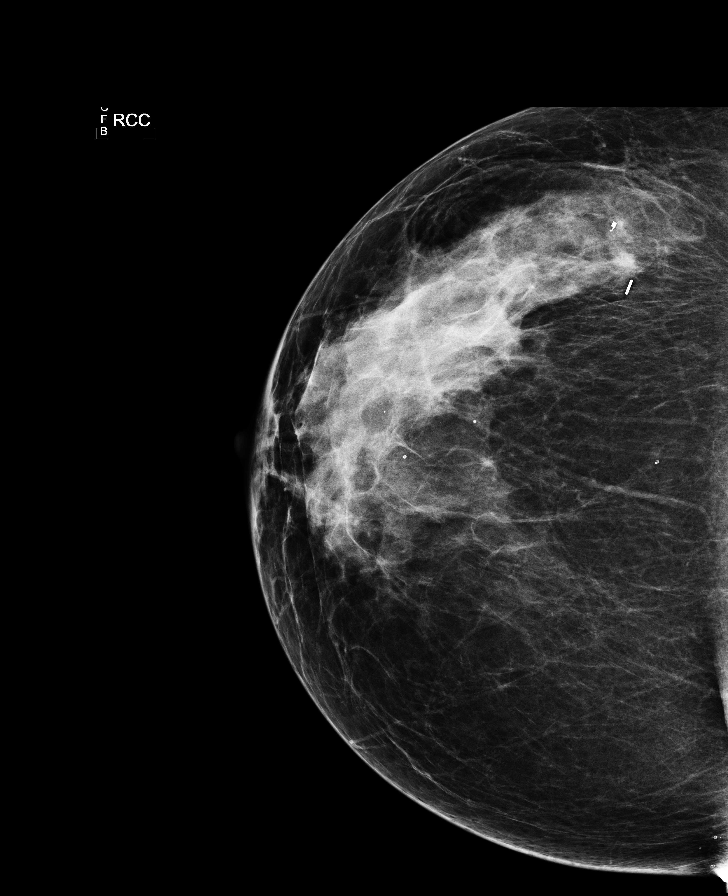

[R MLO (4 of 4)]
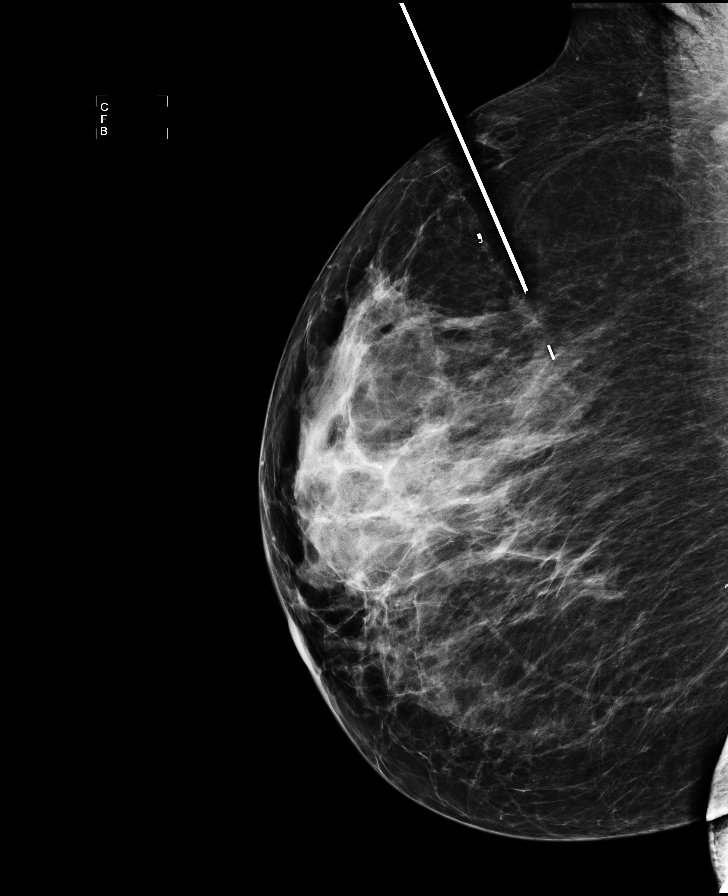

[8 of 9 positions shown; findings below may reference images not displayed]

FINDINGS: Patient presents for radioactive seed localization prior to right
breast surgical excision. I met with the patient and we discussed
the procedure of seed localization including benefits and
alternatives. We discussed the high likelihood of a successful
procedure. We discussed the risks of the procedure including
infection, bleeding, tissue injury and further surgery. We discussed
the low dose of radioactivity involved in the procedure. Informed,
written consent was given.

The usual time-out protocol was performed immediately prior to the
procedure.

Prior to the procedure CC and MLO views of the right breast were
obtained. It was demonstrated that the biopsy marking clip (coil)
had migrated approximately 4 cm superior to the expected location of
biopsy cavity within the right breast. Therefore it was decided that
the expected location of the biopsy cavity was going to be marked
with seed.

Using mammographic guidance, sterile technique, 2% lidocaine and an
9-KUM radioactive seed, biopsy cavity within upper-outer right
breast was localized using a cranial approach. The follow-up
mammogram images confirm the seed in the expected location and were
marked for Dr. Shiguango Tapuy.

Follow-up survey of the patient confirms presence of the radioactive
seed.

Order number of 9-[id number] seed:  [id number].

Total activity:  0.262 mCi  Reference Date: 10/02/2014

The patient tolerated the procedure well and was released from the
[REDACTED]. She was given instructions regarding seed removal.
IMPRESSION: Prior to procedure CC and MLO views of the right breast were
obtained. It was demonstrated that the biopsy marking clip (coil)
had migrated approximately 4 cm superior to the expected location of
biopsy cavity within the right breast. Therefore it was decided that
the expected location of the biopsy cavity was going to be marked
with seed.

Radioactive seed localization right breast. No apparent
complications.

Findings were called to the office of Dr. Shiguango Tapuy on 11/18/2014.

## 2017-07-18 ENCOUNTER — Encounter (INDEPENDENT_AMBULATORY_CARE_PROVIDER_SITE_OTHER): Payer: Self-pay | Admitting: Internal Medicine

## 2017-07-18 ENCOUNTER — Encounter (INDEPENDENT_AMBULATORY_CARE_PROVIDER_SITE_OTHER): Payer: Self-pay

## 2017-07-30 ENCOUNTER — Encounter (INDEPENDENT_AMBULATORY_CARE_PROVIDER_SITE_OTHER): Payer: Self-pay | Admitting: Internal Medicine

## 2017-07-30 ENCOUNTER — Ambulatory Visit (INDEPENDENT_AMBULATORY_CARE_PROVIDER_SITE_OTHER): Payer: Medicare HMO | Admitting: Internal Medicine

## 2017-07-30 VITALS — BP 150/68 | HR 72 | Temp 98.3°F | Ht 65.0 in | Wt 179.3 lb

## 2017-07-30 DIAGNOSIS — R05 Cough: Secondary | ICD-10-CM | POA: Diagnosis not present

## 2017-07-30 DIAGNOSIS — K219 Gastro-esophageal reflux disease without esophagitis: Secondary | ICD-10-CM | POA: Diagnosis not present

## 2017-07-30 DIAGNOSIS — R059 Cough, unspecified: Secondary | ICD-10-CM

## 2017-07-30 MED ORDER — PANTOPRAZOLE SODIUM 40 MG PO TBEC: 40.0000 mg | DELAYED_RELEASE_TABLET | Freq: Two times a day (BID) | ORAL | 3 refills | Status: DC

## 2017-07-30 NOTE — Progress Notes (Signed)
   Subjective:    Patient ID: Kristen CleaverBelinda S Keady, female    DOB: May 24, 1951, 67 y.o.   MRN: [id number]  HPI Referred by Dr. Sherryll BurgerShah for chronic, dry cough. She has had cough x 4 yrs. Saw Dr. Suszanne Connerseoh 4 yrs ago and was told she had acid reflux.  She says she can feel the acid reflux at night when she lies down. She says her throat burns.  Appetite is good. No weight loss. She avoids spicy foods. She says sometimes she get choked.  She has no dysphagia.  She has a BM everyday or every other day. Has started Amitiza. Her last colonoscopy in May of 2018 Dr. Marcha Soldersathey and was normal There are no smokers in her home.    Review of Systems Past Medical History:  Diagnosis Date  . Anxiety   . Arthritis   . Depression   . Hyperlipidemia   . Hypertension   . Type 2 diabetes mellitus (HCC)     Past Surgical History:  Procedure Laterality Date  . BILATERAL CARPAL TUNNEL RELEASE    . BREAST LUMPECTOMY WITH RADIOACTIVE SEED LOCALIZATION Right 11/20/2014   Procedure: BREAST LUMPECTOMY WITH RADIOACTIVE SEED LOCALIZATION;  Surgeon: Chevis PrettyPaul Toth III, MD;  Location: [facility];  Service: General;  Laterality: Right;  . BREAST SURGERY     Left breast Calcium deposits removed  . Bunionectomy left foot    . CARDIAC CATHETERIZATION  2010  . JOINT REPLACEMENT     [facility]  . Removed some bone from  second toe on left foot      Allergies  Allergen Reactions  . Amitiza [Lubiprostone] Nausea Only  . Azithromycin Rash  . Invokana [Canagliflozin] Other (See Comments)    Yeast infection/concern for bladder CA  . Penicillins Itching  . Prednisone Other (See Comments)    ELEVATE SUGAR LEVELS  . Sulfa Antibiotics Rash    Current Outpatient Medications on File Prior to Visit  Medication Sig Dispense Refill  . ALPRAZolam (XANAX) 0.25 MG tablet Take 0.25 mg by mouth 2 (two) times daily as needed for anxiety.    Marland Kitchen. amLODipine (NORVASC) 2.5 MG tablet Take 2.5 mg by mouth daily.     . hydrochlorothiazide (HYDRODIURIL) 25 MG tablet Take 12.5 mg by mouth daily.    Marland Kitchen. losartan (COZAAR) 100 MG tablet Take 100 mg by mouth daily.    Marland Kitchen. zolpidem (AMBIEN) 5 MG tablet Take 5 mg by mouth at bedtime as needed for sleep.     No current facility-administered medications on file prior to visit.         Objective:   Physical Exam Blood pressure (!) 150/68, pulse 72, temperature 98.3 F (36.8 C), height 5\' 5"  (1.651 m), weight 179 lb 4.8 oz (81.3 kg). Alert and oriented. Skin warm and dry. Oral mucosa is moist.   . Sclera anicteric, conjunctivae is pink. Thyroid not enlarged. No cervical lymphadenopathy. Lungs clear. Heart regular rate and rhythm.  Abdomen is soft. Bowel sounds are positive. No hepatomegaly. No abdominal masses felt. No tenderness.  No edema to lower extremities.           Assessment & Plan:  GERD.  Am going to start her on Protonix 40mg  BID. Will schedule and EGD to rule out GERD.

## 2017-07-30 NOTE — Patient Instructions (Addendum)
EGD. The risks of bleeding, perforation and infection were reviewed with patient.  Gastroesophageal Reflux Disease, Adult Normally, food travels down the esophagus and stays in the stomach to be digested. However, when a person has gastroesophageal reflux disease (GERD), food and stomach acid move back up into the esophagus. When this happens, the esophagus becomes sore and inflamed. Over time, GERD can create small holes (ulcers) in the lining of the esophagus. What are the causes? This condition is caused by a problem with the muscle between the esophagus and the stomach (lower esophageal sphincter, or LES). Normally, the LES muscle closes after food passes through the esophagus to the stomach. When the LES is weakened or abnormal, it does not close properly, and that allows food and stomach acid to go back up into the esophagus. The LES can be weakened by certain dietary substances, medicines, and medical conditions, including:  Tobacco use.  Pregnancy.  Having a hiatal hernia.  Heavy alcohol use.  Certain foods and beverages, such as coffee, chocolate, onions, and peppermint.  What increases the risk? This condition is more likely to develop in:  People who have an increased body weight.  People who have connective tissue disorders.  People who use NSAID medicines.  What are the signs or symptoms? Symptoms of this condition include:  Heartburn.  Difficult or painful swallowing.  The feeling of having a lump in the throat.  Abitter taste in the mouth.  Bad breath.  Having a large amount of saliva.  Having an upset or bloated stomach.  Belching.  Chest pain.  Shortness of breath or wheezing.  Ongoing (chronic) cough or a night-time cough.  Wearing away of tooth enamel.  Weight loss.  Different conditions can cause chest pain. Make sure to see your health care provider if you experience chest pain. How is this diagnosed? Your health care provider will take a  medical history and perform a physical exam. To determine if you have mild or severe GERD, your health care provider may also monitor how you respond to treatment. You may also have other tests, including:  An endoscopy toexamine your stomach and esophagus with a small camera.  A test thatmeasures the acidity level in your esophagus.  A test thatmeasures how much pressure is on your esophagus.  A barium swallow or modified barium swallow to show the shape, size, and functioning of your esophagus.  How is this treated? The goal of treatment is to help relieve your symptoms and to prevent complications. Treatment for this condition may vary depending on how severe your symptoms are. Your health care provider may recommend:  Changes to your diet.  Medicine.  Surgery.  Follow these instructions at home: Diet  Follow a diet as recommended by your health care provider. This may involve avoiding foods and drinks such as: ? Coffee and tea (with or without caffeine). ? Drinks that containalcohol. ? Energy drinks and sports drinks. ? Carbonated drinks or sodas. ? Chocolate and cocoa. ? Peppermint and mint flavorings. ? Garlic and onions. ? Horseradish. ? Spicy and acidic foods, including peppers, chili powder, curry powder, vinegar, hot sauces, and barbecue sauce. ? Citrus fruit juices and citrus fruits, such as oranges, lemons, and limes. ? Tomato-based foods, such as red sauce, chili, salsa, and pizza with red sauce. ? Fried and fatty foods, such as donuts, french fries, potato chips, and high-fat dressings. ? High-fat meats, such as hot dogs and fatty cuts of red and white meats, such as rib eye  steak, sausage, ham, and bacon. ? High-fat dairy items, such as whole milk, butter, and cream cheese.  Eat small, frequent meals instead of large meals.  Avoid drinking large amounts of liquid with your meals.  Avoid eating meals during the 2-3 hours before bedtime.  Avoid lying down  right after you eat.  Do not exercise right after you eat. General instructions  Pay attention to any changes in your symptoms.  Take over-the-counter and prescription medicines only as told by your health care provider. Do not take aspirin, ibuprofen, or other NSAIDs unless your health care provider told you to do so.  Do not use any tobacco products, including cigarettes, chewing tobacco, and e-cigarettes. If you need help quitting, ask your health care provider.  Wear loose-fitting clothing. Do not wear anything tight around your waist that causes pressure on your abdomen.  Raise (elevate) the head of your bed 6 inches (15cm).  Try to reduce your stress, such as with yoga or meditation. If you need help reducing stress, ask your health care provider.  If you are overweight, reduce your weight to an amount that is healthy for you. Ask your health care provider for guidance about a safe weight loss goal.  Keep all follow-up visits as told by your health care provider. This is important. Contact a health care provider if:  You have new symptoms.  You have unexplained weight loss.  You have difficulty swallowing, or it hurts to swallow.  You have wheezing or a persistent cough.  Your symptoms do not improve with treatment.  You have a hoarse voice. Get help right away if:  You have pain in your arms, neck, jaw, teeth, or back.  You feel sweaty, dizzy, or light-headed.  You have chest pain or shortness of breath.  You vomit and your vomit looks like blood or coffee grounds.  You faint.  Your stool is bloody or black.  You cannot swallow, drink, or eat. This information is not intended to replace advice given to you by your health care provider. Make sure you discuss any questions you have with your health care provider. Document Released: 04/05/2005 Document Revised: 11/24/2015 Document Reviewed: 10/21/2014 Elsevier Interactive Patient Education  [employer]2018 [employer]vier  Inc[employer]

## 2017-07-31 ENCOUNTER — Encounter (INDEPENDENT_AMBULATORY_CARE_PROVIDER_SITE_OTHER): Payer: Self-pay | Admitting: *Deleted

## 2017-07-31 ENCOUNTER — Encounter (INDEPENDENT_AMBULATORY_CARE_PROVIDER_SITE_OTHER): Payer: Self-pay | Admitting: Internal Medicine

## 2017-07-31 DIAGNOSIS — R059 Cough, unspecified: Secondary | ICD-10-CM | POA: Insufficient documentation

## 2017-07-31 DIAGNOSIS — K219 Gastro-esophageal reflux disease without esophagitis: Secondary | ICD-10-CM

## 2017-07-31 DIAGNOSIS — R05 Cough: Secondary | ICD-10-CM | POA: Insufficient documentation

## 2017-07-31 HISTORY — DX: Gastro-esophageal reflux disease without esophagitis: K21.9

## 2017-08-15 ENCOUNTER — Encounter (HOSPITAL_COMMUNITY)
Admission: RE | Disposition: A | Discharge status: Home / Self Care | Payer: Self-pay | Source: Ambulatory Visit | Attending: Internal Medicine

## 2017-08-15 ENCOUNTER — Other Ambulatory Visit: Payer: Self-pay

## 2017-08-15 ENCOUNTER — Encounter (HOSPITAL_COMMUNITY): Payer: Self-pay | Admitting: *Deleted

## 2017-08-15 ENCOUNTER — Ambulatory Visit (HOSPITAL_COMMUNITY)
Admission: RE | Admit: 2017-08-15 | Discharge: 2017-08-15 | Disposition: A | Discharge status: Home / Self Care | Payer: Medicare HMO | Source: Ambulatory Visit | Attending: Internal Medicine | Admitting: Internal Medicine

## 2017-08-15 DIAGNOSIS — Z7982 Long term (current) use of aspirin: Secondary | ICD-10-CM | POA: Diagnosis not present

## 2017-08-15 DIAGNOSIS — Z888 Allergy status to other drugs, medicaments and biological substances status: Secondary | ICD-10-CM | POA: Diagnosis not present

## 2017-08-15 DIAGNOSIS — R05 Cough: Secondary | ICD-10-CM | POA: Diagnosis not present

## 2017-08-15 DIAGNOSIS — F329 Major depressive disorder, single episode, unspecified: Secondary | ICD-10-CM | POA: Insufficient documentation

## 2017-08-15 DIAGNOSIS — E119 Type 2 diabetes mellitus without complications: Secondary | ICD-10-CM | POA: Diagnosis not present

## 2017-08-15 DIAGNOSIS — Z880 Allergy status to penicillin: Secondary | ICD-10-CM | POA: Diagnosis not present

## 2017-08-15 DIAGNOSIS — Z7984 Long term (current) use of oral hypoglycemic drugs: Secondary | ICD-10-CM | POA: Insufficient documentation

## 2017-08-15 DIAGNOSIS — F419 Anxiety disorder, unspecified: Secondary | ICD-10-CM | POA: Insufficient documentation

## 2017-08-15 DIAGNOSIS — E785 Hyperlipidemia, unspecified: Secondary | ICD-10-CM | POA: Insufficient documentation

## 2017-08-15 DIAGNOSIS — Z882 Allergy status to sulfonamides status: Secondary | ICD-10-CM | POA: Diagnosis not present

## 2017-08-15 DIAGNOSIS — K449 Diaphragmatic hernia without obstruction or gangrene: Secondary | ICD-10-CM | POA: Diagnosis not present

## 2017-08-15 DIAGNOSIS — Z79899 Other long term (current) drug therapy: Secondary | ICD-10-CM | POA: Diagnosis not present

## 2017-08-15 DIAGNOSIS — J392 Other diseases of pharynx: Secondary | ICD-10-CM | POA: Diagnosis not present

## 2017-08-15 DIAGNOSIS — Z87891 Personal history of nicotine dependence: Secondary | ICD-10-CM | POA: Diagnosis not present

## 2017-08-15 DIAGNOSIS — Z881 Allergy status to other antibiotic agents status: Secondary | ICD-10-CM | POA: Diagnosis not present

## 2017-08-15 DIAGNOSIS — R059 Cough, unspecified: Secondary | ICD-10-CM | POA: Insufficient documentation

## 2017-08-15 DIAGNOSIS — I10 Essential (primary) hypertension: Secondary | ICD-10-CM | POA: Insufficient documentation

## 2017-08-15 DIAGNOSIS — R1310 Dysphagia, unspecified: Secondary | ICD-10-CM | POA: Insufficient documentation

## 2017-08-15 DIAGNOSIS — K219 Gastro-esophageal reflux disease without esophagitis: Secondary | ICD-10-CM | POA: Diagnosis not present

## 2017-08-15 HISTORY — PX: ESOPHAGOGASTRODUODENOSCOPY: SHX5428

## 2017-08-15 LAB — GLUCOSE, CAPILLARY: GLUCOSE-CAPILLARY: 122 mg/dL — AB (ref 65–99)

## 2017-08-15 SURGERY — EGD (ESOPHAGOGASTRODUODENOSCOPY)
Anesthesia: Moderate Sedation

## 2017-08-15 MED ORDER — LIDOCAINE VISCOUS 2 % MT SOLN
OROMUCOSAL | Status: AC
  Filled 2017-08-15: qty 15

## 2017-08-15 MED ORDER — LIDOCAINE VISCOUS 2 % MT SOLN
OROMUCOSAL | Status: DC | PRN
  Administered 2017-08-15: 1 via OROMUCOSAL

## 2017-08-15 MED ORDER — MEPERIDINE HCL 50 MG/ML IJ SOLN
INTRAMUSCULAR | Status: DC | PRN
  Administered 2017-08-15 (×2): 25 mg

## 2017-08-15 MED ORDER — BENZONATATE 200 MG PO CAPS: 200.0000 mg | ORAL_CAPSULE | Freq: Every evening | ORAL | 0 refills | Status: AC | PRN

## 2017-08-15 MED ORDER — SODIUM CHLORIDE 0.9 % IV SOLN
INTRAVENOUS | Status: DC
  Administered 2017-08-15: 1000 mL via INTRAVENOUS

## 2017-08-15 MED ORDER — MIDAZOLAM HCL 5 MG/5ML IJ SOLN
INTRAMUSCULAR | Status: AC
  Filled 2017-08-15: qty 10

## 2017-08-15 MED ORDER — MIDAZOLAM HCL 5 MG/5ML IJ SOLN
INTRAMUSCULAR | Status: DC | PRN
  Administered 2017-08-15 (×3): 2 mg via INTRAVENOUS

## 2017-08-15 MED ORDER — MEPERIDINE HCL 50 MG/ML IJ SOLN
INTRAMUSCULAR | Status: AC
  Filled 2017-08-15: qty 1

## 2017-08-15 NOTE — Discharge Instructions (Signed)
Benzonatate 200 mg mg daily at bedtime.  Let the gel capsule dissolved in mouth. Resume other medications as before. Resume usual diet. No driving for 24 hours. Please call office with progress report in 2 weeks.   Upper Endoscopy, Care After Refer to this sheet in the next few weeks. These instructions provide you with information about caring for yourself after your procedure. Your health care provider may also give you more specific instructions. Your treatment has been planned according to current medical practices, but problems sometimes occur. Call your health care provider if you have any problems or questions after your procedure. What can I expect after the procedure? After the procedure, it is common to have:  A sore throat.  Bloating.  Nausea.  Follow these instructions at home:  Follow instructions from your health care provider about what to eat or drink after your procedure.  Return to your normal activities as told by your health care provider. Ask your health care provider what activities are safe for you.  Take over-the-counter and prescription medicines only as told by your health care provider.  Do not drive for 24 hours if you received a sedative.  Keep all follow-up visits as told by your health care provider. This is important. Contact a health care provider if:  You have a sore throat that lasts longer than one day.  You have trouble swallowing. Get help right away if:  You have a fever.  You vomit blood or your vomit looks like coffee grounds.  You have bloody, black, or tarry stools.  You have a severe sore throat or you cannot swallow.  You have difficulty breathing.  You have severe pain in your chest or belly. This information is not intended to replace advice given to you by your health care provider. Make sure you discuss any questions you have with your health care provider. Document Released: 12/26/2011 Document Revised: 12/02/2015  Document Reviewed: 04/08/2015 Elsevier Interactive Patient Education  2018 [employer]  Hiatal Hernia A hiatal hernia occurs when part of the stomach slides above the muscle that separates the abdomen from the chest (diaphragm). A person can be born with a hiatal hernia (congenital), or it may develop over time. In almost all cases of hiatal hernia, only the top part of the stomach pushes through the diaphragm. Many people have a hiatal hernia with no symptoms. The larger the hernia, the more likely it is that you will have symptoms. In some cases, a hiatal hernia allows stomach acid to flow back into the tube that carries food from your mouth to your stomach (esophagus). This may cause heartburn symptoms. Severe heartburn symptoms may mean that you have developed a condition called gastroesophageal reflux disease (GERD). What are the causes? This condition is caused by a weakness in the opening (hiatus) where the esophagus passes through the diaphragm to attach to the upper part of the stomach. A person may be born with a weakness in the hiatus, or a weakness can develop over time. What increases the risk? This condition is more likely to develop in:  Older people. Age is a major risk factor for a hiatal hernia, especially if you are over the age of 89.  Pregnant women.  People who are overweight.  People who have frequent constipation.  What are the signs or symptoms? Symptoms of this condition usually develop in the form of GERD symptoms. Symptoms include:  Heartburn.  Belching.  Indigestion.  Trouble swallowing.  Coughing or wheezing.  Sore throat.  Hoarseness.  Chest pain.  Nausea and vomiting.  How is this diagnosed? This condition may be diagnosed during testing for GERD. Tests that may be done include:  X-rays of your stomach or chest.  An upper gastrointestinal (GI) series. This is an X-ray exam of your GI tract that is taken after you swallow a chalky liquid  that shows up clearly on the X-ray.  Endoscopy. This is a procedure to look into your stomach using a thin, flexible tube that has a tiny camera and light on the end of it.  How is this treated? This condition may be treated by:  Dietary and lifestyle changes to help reduce GERD symptoms.  Medicines. These may include: ? Over-the-counter antacids. ? Medicines that make your stomach empty more quickly. ? Medicines that block the production of stomach acid (H2 blockers). ? Stronger medicines to reduce stomach acid (proton pump inhibitors).  Surgery to repair the hernia, if other treatments are not helping.  If you have no symptoms, you may not need treatment. Follow these instructions at home: Lifestyle and activity  Do not use any products that contain nicotine or tobacco, such as cigarettes and e-cigarettes. If you need help quitting, ask your health care provider.  Try to achieve and maintain a healthy body weight.  Avoid putting pressure on your abdomen. Anything that puts pressure on your abdomen increases the amount of acid that may be pushed up into your esophagus. ? Avoid bending over, especially after eating. ? Raise the head of your bed by putting blocks under the legs. This keeps your head and esophagus higher than your stomach. ? Do not wear tight clothing around your chest or stomach. ? Try not to strain when having a bowel movement, when urinating, or when lifting heavy objects. Eating and drinking  Avoid foods that can worsen GERD symptoms. These may include: ? Fatty foods, like fried foods. ? Citrus fruits, like oranges or lemon. ? Other foods and drinks that contain acid, like orange juice or tomatoes. ? Spicy food. ? Chocolate.  Eat frequent small meals instead of three large meals a day. This helps prevent your stomach from getting too full. ? Eat slowly. ? Do not lie down right after eating. ? Do not eat 1-2 hours before bed.  Do not drink beverages with  caffeine. These include cola, coffee, cocoa, and tea.  Do not drink alcohol. General instructions  Take over-the-counter and prescription medicines only as told by your health care provider.  Keep all follow-up visits as told by your health care provider. This is important. Contact a health care provider if:  Your symptoms are not controlled with medicines or lifestyle changes.  You are having trouble swallowing.  You have coughing or wheezing that will not go away. Get help right away if:  Your pain is getting worse.  Your pain spreads to your arms, neck, jaw, teeth, or back.  You have shortness of breath.  You sweat for no reason.  You feel sick to your stomach (nauseous) or you vomit.  You vomit blood.  You have bright red blood in your stools.  You have black, tarry stools. This information is not intended to replace advice given to you by your health care provider. Make sure you discuss any questions you have with your health care provider. Document Released: 09/16/2003 Document Revised: 06/19/2016 Document Reviewed: 06/19/2016 Elsevier Interactive Patient Education  [employer]2018 Elsevier Inc.

## 2017-08-15 NOTE — H&P (Addendum)
Kristen Gallagher is an 67 y.o. female.   Chief Complaint: Patient is here for EGD. HPI: 67 year old F American female was chronic cough.  She states she has had cough for 4 years.  Lately it has gotten worse.  She was evaluated by Dr. Suszanne Gallagher of ENT service and her cough was felt to be due to GERD.  However she has not responded well to therapy.  She states she does better during the daytime but she has a lot more coughing at night.  She has occasional regurgitation with certain foods.  She denies sore throat or hoarseness.  She never gets heartburn.  She is occasional dysphagia but she feels it occurs because she eats fast and does not chew her food.  Diet is not good and she has lost close to 10 pounds over the last year or so.  She is not trying to lose weight.  She feels pantoprazole has not helped much.  She denies melena or epigastric pain.   Past Medical History:  Diagnosis Date  . Anxiety   . Arthritis   . Depression   . GERD (gastroesophageal reflux disease) 07/31/2017  . Hyperlipidemia   . Hypertension   . Type 2 diabetes mellitus (HCC)     Past Surgical History:  Procedure Laterality Date  . BILATERAL CARPAL TUNNEL RELEASE    . BREAST LUMPECTOMY WITH RADIOACTIVE SEED LOCALIZATION Right 11/20/2014   Procedure: BREAST LUMPECTOMY WITH RADIOACTIVE SEED LOCALIZATION;  Surgeon: Kristen Pretty III, MD;  Location: [facility];  Service: General;  Laterality: Right;  . BREAST SURGERY     Left breast Calcium deposits removed  . Bunionectomy left foot    . CARDIAC CATHETERIZATION  2010  . JOINT REPLACEMENT     Left Knee replacement - Thermal  . Removed some bone from  second toe on left foot      Family History  Problem Relation Age of Onset  . Heart attack Father 57  . Diabetes Mother        father had DM also  . Heart attack Brother 51  . Other Brother 71       tumor in his heart  . Bladder Cancer Sister 31   Social History:  reports that she has quit smoking. she has  never used smokeless tobacco. She reports that she drinks alcohol. She reports that she does not use drugs.  Allergies:  Allergies  Allergen Reactions  . Azithromycin Rash  . Invokana [Canagliflozin] Other (See Comments)    Yeast infection/concern for bladder CA  . Penicillins Itching    Has patient had a PCN reaction causing immediate rash, facial/tongue/throat swelling, SOB or lightheadedness with hypotension: No Has patient had a PCN reaction causing severe rash involving mucus membranes or skin necrosis: No Has patient had a PCN reaction that required hospitalization: No Has patient had a PCN reaction occurring within the last 10 years: No If all of the above answers are "NO", then may proceed with Cephalosporin use.   . Prednisone Other (See Comments)    ELEVATE SUGAR LEVELS  . Sulfa Antibiotics Rash    Medications Prior to Admission  Medication Sig Dispense Refill  . amLODipine (NORVASC) 2.5 MG tablet Take 2.5 mg by mouth daily.    Marland Kitchen aspirin EC 81 MG tablet Take 81 mg by mouth daily.    . Biotin 1000 MCG tablet Take 1,000 mcg by mouth daily.     Marland Kitchen CALCIUM-VITAMIN D PO Take 1 tablet by  mouth daily.    Marland Kitchen. docusate sodium (COLACE) 100 MG capsule Take 100 mg by mouth daily.     Marland Kitchen. gabapentin (NEURONTIN) 300 MG capsule Take 600 mg by mouth at bedtime.    Marland Kitchen. glimepiride (AMARYL) 1 MG tablet Take 1 mg by mouth every evening.    . hydrochlorothiazide (HYDRODIURIL) 25 MG tablet Take 25 mg by mouth daily. Supposed to take 0.5 tablet but takes 1 tablet    . ibuprofen (ADVIL,MOTRIN) 200 MG tablet Take 400 mg by mouth every 8 (eight) hours as needed for mild pain.    Marland Kitchen. losartan (COZAAR) 100 MG tablet Take 100 mg by mouth daily.    Marland Kitchen. lovastatin (MEVACOR) 40 MG tablet Take 40 mg by mouth at bedtime.     Marland Kitchen. lubiprostone (AMITIZA) 24 MCG capsule Take 24 mcg by mouth 2 (two) times daily with a meal.    . metFORMIN (GLUCOPHAGE) 500 MG tablet Take 1,000 mg by mouth daily with breakfast.     .  pantoprazole (PROTONIX) 40 MG tablet Take 1 tablet (40 mg total) by mouth 2 (two) times daily before a meal. 60 tablet 3  . sertraline (ZOLOFT) 100 MG tablet Take 100 mg by mouth daily.    Marland Kitchen. VITAMIN E PO Take 1 tablet by mouth daily.    Marland Kitchen. ALPRAZolam (XANAX) 0.25 MG tablet Take 0.25 mg by mouth 2 (two) times daily as needed for anxiety.    . fluticasone (FLONASE) 50 MCG/ACT nasal spray Place 1 spray into both nostrils daily as needed.     . traZODone (DESYREL) 50 MG tablet Take 50 mg by mouth at bedtime.      Results for orders placed or performed during the hospital encounter of 08/15/17 (from the past 48 hour(s))  Glucose, capillary     Status: Abnormal   Collection Time: 08/15/17 12:58 PM  Result Value Ref Range   Glucose-Capillary 122 (H) 65 - 99 mg/dL   No results found.  ROS  Blood pressure (!) 155/79, pulse 76, temperature 98 F (36.7 C), temperature source Oral, resp. rate 19, height 5\' 5"  (1.651 m), weight 175 lb (79.4 kg), SpO2 100 %. Physical Exam  Constitutional: She appears well-developed and well-nourished.  HENT:  Mouth/Throat: Oropharynx is clear and moist.  Eyes: Conjunctivae are normal. No scleral icterus.  Neck: No thyromegaly present.  Cardiovascular: Normal rate, regular rhythm and normal heart sounds.  No murmur heard. Respiratory: Effort normal and breath sounds normal.  GI: Soft. She exhibits no distension and no mass. There is no tenderness.  Musculoskeletal: She exhibits no edema.  Lymphadenopathy:    She has no cervical adenopathy.  Neurological: She is alert.  Skin: Skin is warm and dry.     Assessment/Plan Chronic cough felt to be secondary to GERD with poor response to therapy. Diagnostic EGD.  Kristen DecemberNajeeb Averey Trompeter, MD 08/15/2017, 1:26 PM

## 2017-08-15 NOTE — Op Note (Signed)
[facility] Patient Name: Kristen Gallagher Procedure Date: 08/15/2017 12:59 PM MRN: [id number] Date of Birth: 03-21-1951 Attending MD: Lionel December , MD CSN: [id number] Age: 67 Admit Type: Outpatient Procedure:                Upper GI endoscopy Indications:              Chronic cough Providers:                Lionel December, MD, Jannett Celestine, RN, Edythe Clarity,                            Technician Referring MD:             Kirstie Peri, MD Medicines:                Lidocaine spray, Meperidine 50 mg IV, Midazolam 6                            mg IV Complications:            No immediate complications. Estimated Blood Loss:     Estimated blood loss: none. Procedure:                Pre-Anesthesia Assessment:                           - Prior to the procedure, a History and Physical                            was performed, and patient medications and                            allergies were reviewed. The patient's tolerance of                            previous anesthesia was also reviewed. The risks                            and benefits of the procedure and the sedation                            options and risks were discussed with the patient.                            All questions were answered, and informed consent                            was obtained. Prior Anticoagulants: The patient                            last took aspirin 2 days prior to the procedure.                            ASA Grade Assessment: II - A patient with mild  systemic disease. After reviewing the risks and                            benefits, the patient was deemed in satisfactory                            condition to undergo the procedure.                           After obtaining informed consent, the endoscope was                            passed under direct vision. Throughout the                            procedure, the patient's blood pressure, pulse, and                      oxygen saturations were monitored continuously. The                            [phone]) was introduced through the mouth,                            and advanced to the second part of duodenum. The                            upper GI endoscopy was accomplished without                            difficulty. The patient tolerated the procedure                            well. Scope In: 1:35:17 PM Scope Out: 1:40:24 PM Total Procedure Duration: 0 hours 5 minutes 7 seconds  Findings:      {skip}Single erosion at hypopharynx.      The examined esophagus was normal.      The Z-line was regular and was found 36 cm from the incisors.      A 2 cm hiatal hernia was present.      The entire examined stomach was normal.      The duodenal bulb and second portion of the duodenum were normal. Impression:               - {skip}Single erosion at hypopharynx.                           - Normal esophagus.                           - Z-line regular, 36 cm from the incisors.                           - 2 cm hiatal hernia.                           - Normal stomach.                           -  Normal duodenal bulb and second portion of the                            duodenum.                           - No specimens collected. Moderate Sedation:      Moderate (conscious) sedation was administered by the endoscopy nurse       and supervised by the endoscopist. The following parameters were       monitored: oxygen saturation, heart rate, blood pressure, CO2       capnography and response to care. Total physician intraservice time was       12 minutes. Recommendation:           - Patient has a contact number available for                            emergencies. The signs and symptoms of potential                            delayed complications were discussed with the                            patient. Return to normal activities tomorrow.                            Written  discharge instructions were provided to the                            patient.                           - Resume previous diet today.                           - Continue present medications.                           - Benzonatate 200 mg qhs as directed.                           - Resume aspirin at prior dose today.                           - Telephone GI clinic in 2 weeks. Procedure Code(s):        --- Professional ---                           [phone], Esophagogastroduodenoscopy, flexible,                            transoral; diagnostic, including collection of                            specimen(s) by brushing or washing, when performed                            (  separate procedure)                           99152, Moderate sedation services provided by the                            same physician or other qualified health care                            professional performing the diagnostic or                            therapeutic service that the sedation supports,                            requiring the presence of an independent trained                            observer to assist in the monitoring of the                            patient's level of consciousness and physiological                            status; initial 15 minutes of intraservice time,                            patient age 43 years or older Diagnosis Code(s):        --- Professional ---                           K44.9, Diaphragmatic hernia without obstruction or                            gangrene                           R05, Cough CPT copyright 2016 American Medical Association. All rights reserved. The codes documented in this report are preliminary and upon coder review may  be revised to meet current compliance requirements. Lionel DecemberNajeeb Chakita Mcgraw, MD Lionel DecemberNajeeb Anjalee Cope, MD 08/15/2017 1:58:20 PM This report has been signed electronically. Number of Addenda: 0

## 2017-08-17 ENCOUNTER — Encounter (HOSPITAL_COMMUNITY): Payer: Self-pay | Admitting: Internal Medicine

## 2017-11-26 ENCOUNTER — Other Ambulatory Visit (INDEPENDENT_AMBULATORY_CARE_PROVIDER_SITE_OTHER): Payer: Self-pay | Admitting: Internal Medicine

## 2018-01-14 ENCOUNTER — Telehealth (INDEPENDENT_AMBULATORY_CARE_PROVIDER_SITE_OTHER): Payer: Self-pay | Admitting: Internal Medicine

## 2018-01-14 DIAGNOSIS — K219 Gastro-esophageal reflux disease without esophagitis: Secondary | ICD-10-CM

## 2018-01-14 MED ORDER — OMEPRAZOLE 40 MG PO CPDR: 40.0000 mg | DELAYED_RELEASE_CAPSULE | Freq: Two times a day (BID) | ORAL | 3 refills | Status: DC

## 2018-01-14 NOTE — Telephone Encounter (Signed)
Rx sent to her pharmacy 

## 2018-01-14 NOTE — Telephone Encounter (Signed)
Patient called stated she is still having swallowing issues - please call her at [phone]

## 2018-02-18 ENCOUNTER — Ambulatory Visit (INDEPENDENT_AMBULATORY_CARE_PROVIDER_SITE_OTHER): Payer: Medicare HMO | Admitting: Otolaryngology

## 2018-02-18 DIAGNOSIS — H608X3 Other otitis externa, bilateral: Secondary | ICD-10-CM

## 2018-02-18 DIAGNOSIS — H903 Sensorineural hearing loss, bilateral: Secondary | ICD-10-CM | POA: Diagnosis not present

## 2018-10-02 ENCOUNTER — Other Ambulatory Visit: Payer: Self-pay | Admitting: Internal Medicine

## 2018-10-02 DIAGNOSIS — R921 Mammographic calcification found on diagnostic imaging of breast: Secondary | ICD-10-CM

## 2018-11-11 ENCOUNTER — Ambulatory Visit
Admission: RE | Admit: 2018-11-11 | Discharge: 2018-11-11 | Disposition: A | Discharge status: Home / Self Care | Payer: Medicare HMO | Source: Ambulatory Visit | Attending: Internal Medicine | Admitting: Internal Medicine

## 2018-11-11 ENCOUNTER — Other Ambulatory Visit: Payer: Self-pay

## 2018-11-11 DIAGNOSIS — R921 Mammographic calcification found on diagnostic imaging of breast: Secondary | ICD-10-CM

## 2019-01-18 ENCOUNTER — Other Ambulatory Visit (INDEPENDENT_AMBULATORY_CARE_PROVIDER_SITE_OTHER): Payer: Self-pay | Admitting: Internal Medicine

## 2019-01-18 DIAGNOSIS — K219 Gastro-esophageal reflux disease without esophagitis: Secondary | ICD-10-CM

## 2019-10-22 ENCOUNTER — Other Ambulatory Visit (INDEPENDENT_AMBULATORY_CARE_PROVIDER_SITE_OTHER): Payer: Self-pay | Admitting: *Deleted

## 2019-10-22 ENCOUNTER — Telehealth (INDEPENDENT_AMBULATORY_CARE_PROVIDER_SITE_OTHER): Payer: Self-pay | Admitting: *Deleted

## 2019-10-22 DIAGNOSIS — K219 Gastro-esophageal reflux disease without esophagitis: Secondary | ICD-10-CM

## 2019-10-22 MED ORDER — OMEPRAZOLE 40 MG PO CPDR: 40.0000 mg | DELAYED_RELEASE_CAPSULE | Freq: Two times a day (BID) | ORAL | 1 refills | Status: AC

## 2019-10-22 NOTE — Telephone Encounter (Signed)
Patient will need OV prior to further refills per Dr.Rehman. This will be within 4 months, appointment may be with Liborio Nixon or Dr.C.

## 2020-03-09 ENCOUNTER — Ambulatory Visit (INDEPENDENT_AMBULATORY_CARE_PROVIDER_SITE_OTHER): Payer: Medicare HMO | Admitting: Internal Medicine

## 2020-04-22 ENCOUNTER — Other Ambulatory Visit (INDEPENDENT_AMBULATORY_CARE_PROVIDER_SITE_OTHER): Payer: Self-pay | Admitting: Internal Medicine

## 2020-04-22 DIAGNOSIS — K219 Gastro-esophageal reflux disease without esophagitis: Secondary | ICD-10-CM

## 2021-01-12 ENCOUNTER — Emergency Department (HOSPITAL_COMMUNITY): Payer: Medicare HMO

## 2021-01-12 ENCOUNTER — Emergency Department (HOSPITAL_COMMUNITY)
Admission: EM | Admit: 2021-01-12 | Discharge: 2021-01-12 | Disposition: A | Discharge status: Home / Self Care | Payer: Medicare HMO | Attending: Emergency Medicine | Admitting: Emergency Medicine

## 2021-01-12 ENCOUNTER — Other Ambulatory Visit: Payer: Self-pay

## 2021-01-12 ENCOUNTER — Encounter (HOSPITAL_COMMUNITY): Payer: Self-pay

## 2021-01-12 DIAGNOSIS — I10 Essential (primary) hypertension: Secondary | ICD-10-CM | POA: Diagnosis not present

## 2021-01-12 DIAGNOSIS — Z87891 Personal history of nicotine dependence: Secondary | ICD-10-CM | POA: Insufficient documentation

## 2021-01-12 DIAGNOSIS — R059 Cough, unspecified: Secondary | ICD-10-CM | POA: Diagnosis not present

## 2021-01-12 DIAGNOSIS — R001 Bradycardia, unspecified: Secondary | ICD-10-CM | POA: Diagnosis not present

## 2021-01-12 DIAGNOSIS — Z7982 Long term (current) use of aspirin: Secondary | ICD-10-CM | POA: Diagnosis not present

## 2021-01-12 DIAGNOSIS — Z79899 Other long term (current) drug therapy: Secondary | ICD-10-CM | POA: Diagnosis not present

## 2021-01-12 DIAGNOSIS — Z96652 Presence of left artificial knee joint: Secondary | ICD-10-CM | POA: Insufficient documentation

## 2021-01-12 DIAGNOSIS — R0602 Shortness of breath: Secondary | ICD-10-CM | POA: Diagnosis present

## 2021-01-12 DIAGNOSIS — Z7984 Long term (current) use of oral hypoglycemic drugs: Secondary | ICD-10-CM | POA: Insufficient documentation

## 2021-01-12 DIAGNOSIS — E119 Type 2 diabetes mellitus without complications: Secondary | ICD-10-CM | POA: Diagnosis not present

## 2021-01-12 DIAGNOSIS — R600 Localized edema: Secondary | ICD-10-CM | POA: Insufficient documentation

## 2021-01-12 DIAGNOSIS — R053 Chronic cough: Secondary | ICD-10-CM

## 2021-01-12 LAB — CBC WITH DIFFERENTIAL/PLATELET
Abs Immature Granulocytes: 0.01 10*3/uL (ref 0.00–0.07)
Basophils Absolute: 0 10*3/uL (ref 0.0–0.1)
Basophils Relative: 1 %
Eosinophils Absolute: 0.1 10*3/uL (ref 0.0–0.5)
Eosinophils Relative: 4 %
HCT: 34.6 % — ABNORMAL LOW (ref 36.0–46.0)
Hemoglobin: 10.6 g/dL — ABNORMAL LOW (ref 12.0–15.0)
Immature Granulocytes: 0 %
Lymphocytes Relative: 31 %
Lymphs Abs: 1.1 10*3/uL (ref 0.7–4.0)
MCH: 24.3 pg — ABNORMAL LOW (ref 26.0–34.0)
MCHC: 30.6 g/dL (ref 30.0–36.0)
MCV: 79.2 fL — ABNORMAL LOW (ref 80.0–100.0)
Monocytes Absolute: 0.4 10*3/uL (ref 0.1–1.0)
Monocytes Relative: 10 %
Neutro Abs: 1.9 10*3/uL (ref 1.7–7.7)
Neutrophils Relative %: 54 %
Platelets: 366 10*3/uL (ref 150–400)
RBC: 4.37 MIL/uL (ref 3.87–5.11)
RDW: 15.4 % (ref 11.5–15.5)
WBC: 3.5 10*3/uL — ABNORMAL LOW (ref 4.0–10.5)
nRBC: 0 % (ref 0.0–0.2)

## 2021-01-12 LAB — COMPREHENSIVE METABOLIC PANEL
ALT: 19 U/L (ref 0–44)
AST: 23 U/L (ref 15–41)
Albumin: 3.9 g/dL (ref 3.5–5.0)
Alkaline Phosphatase: 71 U/L (ref 38–126)
Anion gap: 9 (ref 5–15)
BUN: 12 mg/dL (ref 8–23)
CO2: 26 mmol/L (ref 22–32)
Calcium: 9.6 mg/dL (ref 8.9–10.3)
Chloride: 97 mmol/L — ABNORMAL LOW (ref 98–111)
Creatinine, Ser: 0.55 mg/dL (ref 0.44–1.00)
GFR, Estimated: >60 mL/min (ref >60)
Glucose, Bld: 124 mg/dL — ABNORMAL HIGH (ref 70–99)
Potassium: 3.4 mmol/L — ABNORMAL LOW (ref 3.5–5.1)
Sodium: 132 mmol/L — ABNORMAL LOW (ref 135–145)
Total Bilirubin: 0.3 mg/dL (ref 0.3–1.2)
Total Protein: 7.4 g/dL (ref 6.5–8.1)

## 2021-01-12 LAB — TROPONIN I (HIGH SENSITIVITY)
Troponin I (High Sensitivity): 5 ng/L (ref <18)
Troponin I (High Sensitivity): 5 ng/L (ref <18)

## 2021-01-12 LAB — BRAIN NATRIURETIC PEPTIDE: B Natriuretic Peptide: 127 pg/mL — ABNORMAL HIGH (ref 0.0–100.0)

## 2021-01-12 NOTE — ED Provider Notes (Signed)
Emergency Medicine Provider Triage Evaluation Note  Kristen Gallagher , a 70 y.o. female  was evaluated in triage.  Pt complains of progressively worsening shortness of breath over the past month or so.  Has had associated chest tightness, cough, as well as bilateral lower extremity swelling.  She went to [facility] emergency department last night but did not stay due to long wait time.  Review of Systems  Positive: Chest tightness, dyspnea, cough, leg swelling Negative: Hemoptysis, vomiting, dizziness  Physical Exam  BP (!) 174/51 (BP Location: Right Arm)   Pulse (!) 47   Temp 98.6 F (37 C) (Oral)   Resp 20   Ht 5\' 4"  (1.626 m)   Wt 68.5 kg   SpO2 98%   BMI 25.92 kg/m  Gen:   Awake, no distress   Resp:  Normal effort  MSK:   Mild edema to the bilateral lower legs, left appears somewhat more swollen than right.  Medical Decision Making  Medically screening exam initiated at 5:35 PM.  Appropriate orders placed.  Kristen Gallagher was informed that the remainder of the evaluation will be completed by another provider, this initial triage assessment does not replace that evaluation, and the importance of remaining in the ED until their evaluation is complete.  Shortness of breath.   Kristen Gallagher 01/12/21 1741    03/15/21, MD 01/12/21 [phone]

## 2021-01-12 NOTE — ED Notes (Signed)
Pt walked from waiting room to room 17 with o2 checked and pt was 98%

## 2021-01-12 NOTE — ED Provider Notes (Signed)
[facility] EMERGENCY DEPARTMENT Provider Note   CSN: [id number] Arrival date & time: 01/12/21  1527     History Chief Complaint  Patient presents with   Shortness of Breath    Kristen Gallagher is a 70 y.o. female.   Shortness of Breath    Pt is a 70 y/o female - has hx of T2DM and Htn as well as a history of SOB for quite some time.  She had been diagnosed with COVID she states this was in May, she reports that since that time she has had shortness of breath and coughing, she is cannot seem to stop having coughing and shortness of breath.  She denies any cardiac history, she denies taking any beta-blockers or calcium channel blockers and when I look at her medical history and medications she in fact does take lisinopril and amlodipine but does not take any AV nodal blocking agents.  The patient reports no increased swelling of her legs, she has had chronic mild swelling of her legs which seems to be related to the time of day and gravity and seems to go away at night.  She is not having fevers she is not having chest pain, she is not having a headache.  When the COVID resolve the patient continued to be short of breath and cough and has had multiple visits to emergency departments primary care doctors urgent cares and last night went to [facility] emergency department where she had lab work done.  She left after an 8-hour wait but was called by the nurse telling her that she needed to go to the hospital today because her testing showed that she may have fluid around her heart or her lungs.  The patient states that her cough is nonproductive, it gets better with albuterol but the albuterol does not last very long.  She has been on antibiotics she has been on prednisone and nothing seems to help.  Past Medical History:  Diagnosis Date   Anxiety    Arthritis    Depression    GERD (gastroesophageal reflux disease) 07/31/2017   Hyperlipidemia    Hypertension    Type 2 diabetes  [facility])     Patient Active Problem List   Diagnosis Date Noted   GERD (gastroesophageal reflux disease) 07/31/2017   Gastroesophageal reflux disease without esophagitis 07/31/2017   Cough 07/31/2017   HYPERCHOLESTEROLEMIA  IIA 04/06/2009   Essential hypertension 04/06/2009   ABNORMAL CV (STRESS) TEST 04/06/2009    Past Surgical History:  Procedure Laterality Date   BILATERAL CARPAL TUNNEL RELEASE     BREAST LUMPECTOMY WITH RADIOACTIVE SEED LOCALIZATION Right 11/20/2014   Procedure: BREAST LUMPECTOMY WITH RADIOACTIVE SEED LOCALIZATION;  Surgeon: Chevis Pretty III, MD;  Location: [facility];  Service: General;  Laterality: Right;   BREAST SURGERY     Left breast Calcium deposits removed   Bunionectomy left foot     CARDIAC CATHETERIZATION  2010   ESOPHAGOGASTRODUODENOSCOPY N/A 08/15/2017   Procedure: ESOPHAGOGASTRODUODENOSCOPY (EGD);  Surgeon: Malissa Hippo, MD;  Location: [facility];  Service: Endoscopy;  Laterality: N/A;  1:25   JOINT REPLACEMENT     Left Knee replacement - [facility]   Removed some bone from  second toe on left foot       OB History   No obstetric history on file.     Family History  Problem Relation Age of Onset   Heart attack Father 47   Diabetes Mother  father had DM also   Heart attack Brother 52   Other Brother 60       tumor in his heart   Bladder Cancer Sister 67    Social History   Tobacco Use   Smoking status: Former    Pack years: 0.00   Smokeless tobacco: Never   Tobacco comments:    Quit smoking 40 years ago  Vaping Use   Vaping Use: Never used  Substance Use Topics   Alcohol use: Yes    Alcohol/week: 0.0 standard drinks    Comment: Drinks once every 3-4 months    Drug use: No    Home Medications Prior to Admission medications   Medication Sig Start Date End Date Taking? Authorizing Provider  gabapentin (NEURONTIN) 300 MG capsule Take 600 mg by mouth at bedtime.   Yes [provider]   glimepiride (AMARYL) 1 MG tablet Take 1 mg by mouth every evening.   Yes [provider]  pantoprazole (PROTONIX) 40 MG tablet TAKE 1 TABLET BY MOUTH 2 TIMES DAILY BEFORE A MEAL. 11/26/17  Yes Setzer, Brand Males, NP  traZODone (DESYREL) 50 MG tablet Take 50 mg by mouth at bedtime.   Yes [provider]  albuterol (VENTOLIN HFA) 108 (90 Base) MCG/ACT inhaler SMARTSIG:1 Puff(s) By Mouth Every 4-6 Hours PRN 12/07/20   [provider]  ALPRAZolam (XANAX) 0.25 MG tablet Take 0.25 mg by mouth 2 (two) times daily as needed for anxiety. Patient not taking: Reported on 01/12/2021    [provider]  amLODipine (NORVASC) 2.5 MG tablet Take 2.5 mg by mouth daily.    [provider]  aspirin EC 81 MG tablet Take 81 mg by mouth daily.    [provider]  benzonatate (TESSALON) 200 MG capsule Take 1 capsule (200 mg total) by mouth at bedtime as needed for cough. 08/15/17   Malissa Hippo, MD  Biotin 1000 MCG tablet Take 1,000 mcg by mouth daily.     [provider]  CALCIUM-VITAMIN D PO Take 1 tablet by mouth daily.    [provider]  docusate sodium (COLACE) 100 MG capsule Take 100 mg by mouth daily.     [provider]  fluticasone (FLONASE) 50 MCG/ACT nasal spray Place 1 spray into both nostrils daily as needed.  03/10/17   [provider]  hydrochlorothiazide (HYDRODIURIL) 25 MG tablet Take 25 mg by mouth daily. Supposed to take 0.5 tablet but takes 1 tablet    [provider]  ibuprofen (ADVIL,MOTRIN) 200 MG tablet Take 400 mg by mouth every 8 (eight) hours as needed for mild pain.    [provider]  losartan (COZAAR) 100 MG tablet Take 100 mg by mouth daily.    [provider]  lovastatin (MEVACOR) 40 MG tablet Take 40 mg by mouth at bedtime.  04/30/17   [provider]  lubiprostone (AMITIZA) 24 MCG capsule Take 24 mcg by mouth 2 (two) times daily with a meal.    [provider]   metFORMIN (GLUCOPHAGE) 500 MG tablet Take 1,000 mg by mouth daily with breakfast.     [provider]  omeprazole (PRILOSEC) 40 MG capsule Take 1 capsule (40 mg total) by mouth 2 (two) times daily. 30 minutes before breakfast and 30 minutes before supper 10/22/19   Malissa Hippo, MD  sertraline (ZOLOFT) 100 MG tablet Take 100 mg by mouth daily.    [provider]  VITAMIN E PO Take 1 tablet  by mouth daily.    [provider]    Allergies    Azithromycin, Invokana [canagliflozin], Penicillins, Prednisone, and Sulfa antibiotics  Review of Systems   Review of Systems  Respiratory:  Positive for shortness of breath.   All other systems reviewed and are negative.  Physical Exam Updated Vital Signs BP (!) 162/64   Pulse (!) 49   Temp 98.6 F (37 C) (Oral)   Resp 20   Ht 1.626 m ( )   Wt 68.5 kg   SpO2 100%   BMI 25.92 kg/m   Physical Exam Vitals and nursing note reviewed.  Constitutional:      General: She is not in acute distress.    Appearance: She is well-developed.  HENT:     Head: Normocephalic and atraumatic.     Mouth/Throat:     Pharynx: No oropharyngeal exudate.  Eyes:     General: No scleral icterus.       Right eye: No discharge.        Left eye: No discharge.     Conjunctiva/sclera: Conjunctivae normal.     Pupils: Pupils are equal, round, and reactive to light.  Neck:     Thyroid: No thyromegaly.     Vascular: No JVD.  Cardiovascular:     Rate and Rhythm: Regular rhythm. Bradycardia present.     Heart sounds: Normal heart sounds. No murmur heard.   No friction rub. No gallop.     Comments: Pulse is 48, normal blood pressure at 162/64, normal mentation, no JVD, normal heart sounds Pulmonary:     Effort: Pulmonary effort is normal. No respiratory distress.     Breath sounds: Normal breath sounds. No wheezing or rales.     Comments: Lung sounds are totally clear Abdominal:     General: Bowel sounds are normal. There is no  distension.     Palpations: Abdomen is soft. There is no mass.     Tenderness: There is no abdominal tenderness.  Musculoskeletal:        General: No tenderness. Normal range of motion.     Cervical back: Normal range of motion and neck supple.     Right lower leg: Edema present.     Left lower leg: Edema present.     Comments: Scant symmetrical pitting edema of the ankles  Lymphadenopathy:     Cervical: No cervical adenopathy.  Skin:    General: Skin is warm and dry.     Findings: No erythema or rash.  Neurological:     Mental Status: She is alert.     Coordination: Coordination normal.  Psychiatric:        Behavior: Behavior normal.    ED Results / Procedures / Treatments   Labs (all labs ordered are listed, but only abnormal results are displayed) Labs Reviewed  COMPREHENSIVE METABOLIC PANEL - Abnormal; Notable for the following components:      Result Value   Sodium 132 (*)    Potassium 3.4 (*)    Chloride 97 (*)    Glucose, Bld 124 (*)    All other components within normal limits  CBC WITH DIFFERENTIAL/PLATELET - Abnormal; Notable for the following components:   WBC 3.5 (*)    Hemoglobin 10.6 (*)    HCT 34.6 (*)    MCV 79.2 (*)    MCH 24.3 (*)    All other components within normal limits  BRAIN NATRIURETIC PEPTIDE - Abnormal; Notable for the following components:   B  Natriuretic Peptide 127.0 (*)    All other components within normal limits  TROPONIN I (HIGH SENSITIVITY)  TROPONIN I (HIGH SENSITIVITY)    EKG EKG Interpretation  Date/Time:  Wednesday January 12 2021 17:31:24 EDT Ventricular Rate:  47 PR Interval:  254 QRS Duration: 78 QT Interval:  500 QTC Calculation: 442 R Axis:   41 Text Interpretation: Sinus bradycardia with 1st degree A-V block Cannot rule out Anterior infarct , age undetermined Abnormal ECG Confirmed by Eber Hong ([id number]) on 01/12/2021 8:29:50 PM  Radiology DG Chest 2 View  Result Date: 01/12/2021 CLINICAL DATA:  Shortness of breath  EXAM: CHEST - 2 VIEW COMPARISON:  None. FINDINGS: The heart size and mediastinal contours are within normal limits. Both lungs are clear. Chronic appearing left upper rib fractures. IMPRESSION: No active cardiopulmonary disease. Electronically Signed   By: Jasmine Pang M.D.   On: 01/12/2021 18:23    Procedures Procedures   Medications Ordered in ED Medications - No data to display  ED Course  I have reviewed the triage vital signs and the nursing notes.  Pertinent labs & imaging results that were available during my care of the patient were reviewed by me and considered in my medical decision making (see chart for details).    MDM Rules/Calculators/A&P                          This patient reports that she has ongoing shortness of breath however she has a normal troponin x2, she has a normal metabolic panel which has not shown any explanations for her symptoms.  Her blood counts are rather unremarkable with mild anemia, white blood cell count of 3500.  Her BNP is 127 and her chest x-ray showed no abnormalities whatsoever.  The EKG did show some mild sinus bradycardia but the patient is asymptomatic with regards to this.  I will refer her to cardiology, she may need another echocardiogram though she does state that she has had 1, she cannot remember when or where it was.  This does not appear to be acute congestive heart failure or myocardial infarction, she does not have pneumonia, she is not hypoxic and my 15-minute interaction with the patient she did not cough a single time.  She appears stable for discharge  Referral to Pulmonology and Cardiology - pt only has a PCP - Dr. Sherryll Burger of [facility]  Final Clinical Impression(s) / ED Diagnoses Final diagnoses:  Shortness of breath  Chronic cough  Bradycardia    Rx / DC Orders ED Discharge Orders          Ordered    Ambulatory referral to Cardiology       Comments: Bradycardia   01/12/21 2103    Ambulatory referral to Pulmonology         01/12/21 2103             Eber Hong, MD 01/12/21 2104

## 2021-01-12 NOTE — ED Triage Notes (Signed)
Pt. States they were told to come to the ED by [facility]. Pt. States they told her she has fluid around her lungs and heart. Pt. Is complaining of having a hard time breathing.

## 2021-01-12 NOTE — Discharge Instructions (Addendum)
Your testing has been normal, there is no signs of fluid around your heart or your lungs, your blood work was unremarkable and reassuring.  Please be aware that you do have a slightly slow heart rate and if you do develop increasing weakness palpitations or chest pain return to the emergency department immediately.  I have asked for both the lung specialist and the heart specialist offices to contact you to make appointments to be seen.  Please answer your phone to make sure that you do not miss these calls.  Emergency department for worsening symptoms

## 2021-02-22 ENCOUNTER — Ambulatory Visit: Payer: Medicare HMO | Admitting: Cardiology

## 2021-03-03 ENCOUNTER — Institutional Professional Consult (permissible substitution): Payer: Medicare HMO | Admitting: Pulmonary Disease

## 2021-03-25 ENCOUNTER — Encounter: Payer: Self-pay | Admitting: Allergy & Immunology

## 2021-03-25 ENCOUNTER — Other Ambulatory Visit: Payer: Self-pay

## 2021-03-25 ENCOUNTER — Ambulatory Visit (INDEPENDENT_AMBULATORY_CARE_PROVIDER_SITE_OTHER): Payer: Medicare HMO | Admitting: Allergy & Immunology

## 2021-03-25 VITALS — BP 150/70 | HR 77 | Temp 98.0°F | Resp 16 | Ht 64.0 in | Wt 148.0 lb

## 2021-03-25 DIAGNOSIS — J310 Chronic rhinitis: Secondary | ICD-10-CM

## 2021-03-25 DIAGNOSIS — J984 Other disorders of lung: Secondary | ICD-10-CM | POA: Diagnosis not present

## 2021-03-25 DIAGNOSIS — R059 Cough, unspecified: Secondary | ICD-10-CM

## 2021-03-25 MED ORDER — ALBUTEROL SULFATE HFA 108 (90 BASE) MCG/ACT IN AERS: INHALATION_SPRAY | RESPIRATORY_TRACT | 5 refills | Status: AC

## 2021-03-25 MED ORDER — BREZTRI AEROSPHERE 160-9-4.8 MCG/ACT IN AERO: 2.0000 | INHALATION_SPRAY | Freq: Two times a day (BID) | RESPIRATORY_TRACT | 11 refills | Status: DC

## 2021-03-25 MED ORDER — BREZTRI AEROSPHERE 160-9-4.8 MCG/ACT IN AERO: 2.0000 | INHALATION_SPRAY | Freq: Two times a day (BID) | RESPIRATORY_TRACT | 5 refills | Status: DC

## 2021-03-25 MED ORDER — BREZTRI AEROSPHERE 160-9-4.8 MCG/ACT IN AERO: 2.0000 | INHALATION_SPRAY | Freq: Two times a day (BID) | RESPIRATORY_TRACT | 5 refills | Status: AC

## 2021-03-25 NOTE — Progress Notes (Signed)
NEW PATIENT  Date of Service/Encounter:  03/25/21  Consult requested by: Kirstie Peri, MD   Assessment:   Chronic rhinitis   Cough  Restrictive lung disease   Plan/Recommendations:   1. Cough - Your lung testing was in the 50% range and it improved slightly with the Xopenex treatment.  - Spacer sample and demonstration provided. - Start the prednisone burst provided today. - Daily controller medication(s): Breztri two puffs twice daily WITH SPACER - Prior to physical activity: ProAir 2 puffs 10-15 minutes before physical activity. - Rescue medications: ProAir 4 puffs every 4-6 hours as needed - Asthma control goals:  * Full participation in all desired activities (may need albuterol before activity) * Albuterol use two time or less a week on average (not counting use with activity) * Cough interfering with sleep two time or less a month * Oral steroids no more than once a year * No hospitalizations  2. Chronic rhinitis - We are going to get some labs to look for environmental allergies since your lung testing was not great. - Sometimes testing can make lung function worse, so I did not want to take chances with you.   3. Return in about 2 weeks (around 04/08/2021).     This note in its entirety was forwarded to the Provider who requested this consultation.  Subjective:   Kristen Gallagher is a 70 y.o. female presenting today for evaluation of  Chief Complaint  Patient presents with   Allergy Testing    Kristen Gallagher has a history of the following: Patient Active Problem List   Diagnosis Date Noted   GERD (gastroesophageal reflux disease) 07/31/2017   Gastroesophageal reflux disease without esophagitis 07/31/2017   Cough 07/31/2017   HYPERCHOLESTEROLEMIA  IIA 04/06/2009   Essential hypertension 04/06/2009   ABNORMAL CV (STRESS) TEST 04/06/2009    History obtained from: chart review and patient.  Kristen Gallagher was referred by Kirstie Peri, MD.      Kristen Gallagher is a 70 y.o. female presenting for an evaluation of asthma and allergies.   She started having issues in June. She had EKGS and CXR. She was not able to breathe but everything looke great. She was coughing continuuously despite all of this. As time went on, she started having problems with fatigue. She passed out in July or August. She had just walked into the door. She started vomiting. She had a HR of 38 in the ED. 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 But the cough has continued despite lal of this.   She will cough and then she will get a chill. She started having issues with coughing. Nothing seems to help the cough. The pacemaker if better since having the pacemaker installed. She was placed on Breztri but this was too expensive. She was also put on another one which was even more expensive. She has a ProAir inhaler that does seem to help. She was given that inhaler around the summer sometime. She thinks that the [place] made her cough more. She used a sample and then one month supply that she paid for.   She also started having itching when she went outdoors as well. This a new thing for her. She has never had any kind of allergy symptoms prior to that.   She is now triggered with certain foods and scents.   She is retired. 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   Otherwise, there is no history of other atopic diseases, including drug allergies, stinging insect allergies, eczema, urticaria, or contact dermatitis. There is no significant infectious history. Vaccinations are up to date.    Past Medical History: Patient Active Problem List   Diagnosis Date Noted   GERD (gastroesophageal reflux disease) 07/31/2017   Gastroesophageal reflux disease without esophagitis 07/31/2017   Cough 07/31/2017   HYPERCHOLESTEROLEMIA  IIA 04/06/2009    Essential hypertension 04/06/2009   ABNORMAL CV (STRESS) TEST 04/06/2009    Medication List:  Allergies as of 03/25/2021       Reactions   Azithromycin Rash   Invokana [canagliflozin] Other (See Comments)   Yeast infection/concern for bladder CA   Penicillins Itching   Has patient had a PCN reaction causing immediate rash, facial/tongue/throat swelling, SOB or lightheadedness with hypotension: No Has patient had a PCN reaction causing severe rash involving mucus membranes or skin necrosis: No Has patient had a PCN reaction that required hospitalization: No Has patient had a PCN reaction occurring within the last 10 years: No If all of the above answers are "NO", then may proceed with Cephalosporin use.   Prednisone Other (See Comments)   ELEVATE SUGAR LEVELS   Sulfa Antibiotics Rash        Medication List        Accurate as of March 25, 2021  1:42 PM. If you have any questions, ask your nurse or doctor.          albuterol 108 (90 Base) MCG/ACT inhaler Commonly known as: VENTOLIN HFA SMARTSIG:1 Puff(s) By Mouth Every 4-6 Hours PRN   ALPRAZolam 0.25 MG tablet Commonly known as: XANAX Take 0.25 mg by mouth 2 (two) times daily as needed for anxiety.   amLODipine 2.5 MG tablet Commonly known as: NORVASC Take 10 mg by mouth daily.   amLODipine 10 MG tablet Commonly known as: NORVASC Take 10 mg by mouth daily.   aspirin EC 81 MG tablet Take 81 mg by mouth daily.   benzonatate 200 MG capsule Commonly known as: TESSALON Take 1 capsule (200 mg total) by mouth at bedtime as needed for cough.   Biotin 1000 MCG tablet Take 1,000 mcg by mouth daily.   Breztri Aerosphere 160-9-4.8 MCG/ACT Aero Generic drug: Budeson-Glycopyrrol-Formoterol Inhale 2 puffs into the lungs in the morning and at bedtime. Started by: Alfonse Spruce, MD   CALCIUM-VITAMIN D PO Take 1 tablet by mouth daily.   docusate sodium 100 MG capsule Commonly known as: COLACE Take 100 mg by  mouth daily.   fluticasone 50 MCG/ACT nasal spray Commonly known as: FLONASE Place 1 spray into both nostrils daily as needed.   gabapentin 300 MG capsule Commonly known as: NEURONTIN Take 600 mg by mouth at bedtime.   gabapentin 600 MG tablet Commonly known as: NEURONTIN Take 600 mg by mouth daily.   glimepiride 1 MG tablet Commonly known as: AMARYL Take 1 mg by mouth every evening.   hydrochlorothiazide 25 MG tablet Commonly known as: HYDRODIURIL Take 25 mg by mouth daily. Supposed to take 0.5 tablet but takes 1 tablet   ibuprofen 200 MG tablet Commonly known as: ADVIL Take 400 mg by mouth every 8 (eight) hours as needed for mild pain.   ibuprofen 200 MG tablet Commonly known as: ADVIL Take by mouth.   lisinopril 2.5 MG tablet Commonly known as: ZESTRIL Take 2.5 mg by mouth daily.   losartan  100 MG tablet Commonly known as: COZAAR Take 100 mg by mouth daily.   lovastatin 40 MG tablet Commonly known as: MEVACOR Take 40 mg by mouth at bedtime.   lubiprostone 24 MCG capsule Commonly known as: AMITIZA Take 24 mcg by mouth 2 (two) times daily with a meal.   metFORMIN 500 MG tablet Commonly known as: GLUCOPHAGE Take 1,000 mg by mouth daily with breakfast.   omeprazole 40 MG capsule Commonly known as: PRILOSEC Take 1 capsule (40 mg total) by mouth 2 (two) times daily. 30 minutes before breakfast and 30 minutes before supper What changed: when to take this   pantoprazole 40 MG tablet Commonly known as: PROTONIX TAKE 1 TABLET BY MOUTH 2 TIMES DAILY BEFORE A MEAL.   sertraline 100 MG tablet Commonly known as: ZOLOFT Take 100 mg by mouth daily.   traZODone 50 MG tablet Commonly known as: DESYREL Take 50 mg by mouth at bedtime.   VITAMIN E PO Take 1 tablet by mouth daily.        Birth History: non-contributory  Developmental History: non-contributory  Past Surgical History: Past Surgical History:  Procedure Laterality Date   BILATERAL CARPAL  TUNNEL RELEASE     BREAST LUMPECTOMY WITH RADIOACTIVE SEED LOCALIZATION Right 11/20/2014   Procedure: BREAST LUMPECTOMY WITH RADIOACTIVE SEED LOCALIZATION;  Surgeon: Chevis Pretty III, MD;  Location: [facility];  Service: General;  Laterality: Right;   BREAST SURGERY     Left breast Calcium deposits removed   Bunionectomy left foot     CARDIAC CATHETERIZATION  2010   ESOPHAGOGASTRODUODENOSCOPY N/A 08/15/2017   Procedure: ESOPHAGOGASTRODUODENOSCOPY (EGD);  Surgeon: Malissa Hippo, MD;  Location: [facility];  Service: Endoscopy;  Laterality: N/A;  1:25   JOINT REPLACEMENT     Left Knee replacement - [facility]   Removed some bone from  second toe on left foot       Family History: Family History  Problem Relation Age of Onset   Heart attack Father 45   Diabetes Mother        father had DM also   Heart attack Brother 35   Other Brother 53       tumor in his heart   Bladder Cancer Sister 66     Social History: Kristen Gallagher lives at home with her husband.  She lives in a house with hardwood throughout the home.  She has gas heating and central cooling.  There is a cat inside of the home.  There are no animals outside of the home.  There is no tobacco exposure.  She is not exposed to fumes, chemicals, or dust.  She does use a HEPA filter.  She previously worked as a Engineer, petroleum for several years.  She has been retired for more than 10 years.  Review of Systems  Constitutional: Negative.  Negative for chills, fever, malaise/fatigue and weight loss.  HENT:  Positive for congestion. Negative for ear discharge, ear pain and sinus pain.   Eyes:  Negative for pain, discharge and redness.  Respiratory:  Negative for cough, sputum production, shortness of breath and wheezing.   Cardiovascular: Negative.  Negative for chest pain and palpitations.  Gastrointestinal:  Negative for abdominal pain, constipation, diarrhea, heartburn, nausea and vomiting.  Skin: Negative.  Negative for  itching and rash.  Neurological:  Negative for dizziness and headaches.  Endo/Heme/Allergies:  Positive for environmental allergies. Does not bruise/bleed easily.      Objective:   Blood pressure Marland Kitchen)  150/70, pulse 77, temperature 98 F (36.7 C), temperature source Temporal, resp. rate 16, height 5\' 4"  (1.626 m), weight 148 lb (67.1 kg), SpO2 98 %. Body mass index is 25.4 kg/m.   Physical Exam:   Physical Exam Vitals reviewed.  Constitutional:      Appearance: She is well-developed.  HENT:     Head: Normocephalic and atraumatic.     Right Ear: Tympanic membrane, ear canal and external ear normal. No drainage, swelling or tenderness. Tympanic membrane is not injected, scarred, erythematous, retracted or bulging.     Left Ear: Tympanic membrane, ear canal and external ear normal. No drainage, swelling or tenderness. Tympanic membrane is not injected, scarred, erythematous, retracted or bulging.     Nose: No nasal deformity, septal deviation, mucosal edema or rhinorrhea.     Right Turbinates: Enlarged and swollen.     Left Turbinates: Enlarged and swollen.     Right Sinus: No maxillary sinus tenderness or frontal sinus tenderness.     Left Sinus: No maxillary sinus tenderness or frontal sinus tenderness.     Mouth/Throat:     Mouth: Mucous membranes are not pale and not dry.     Pharynx: Uvula midline.  Eyes:     General:        Right eye: No discharge.        Left eye: No discharge.     Conjunctiva/sclera: Conjunctivae normal.     Right eye: Right conjunctiva is not injected. No chemosis.    Left eye: Left conjunctiva is not injected. No chemosis.    Pupils: Pupils are equal, round, and reactive to light.  Cardiovascular:     Rate and Rhythm: Normal rate and regular rhythm.     Heart sounds: Normal heart sounds.  Pulmonary:     Effort: Pulmonary effort is normal. No tachypnea, accessory muscle usage or respiratory distress.     Breath sounds: Decreased breath sounds  present. No wheezing, rhonchi or rales.     Comments: Decreased air movement at the bases.  Chest:     Chest wall: No tenderness.  Abdominal:     Tenderness: There is no abdominal tenderness. There is no guarding or rebound.  Lymphadenopathy:     Head:     Right side of head: No submandibular, tonsillar or occipital adenopathy.     Left side of head: No submandibular, tonsillar or occipital adenopathy.     Cervical: No cervical adenopathy.  Skin:    General: Skin is warm.     Capillary Refill: Capillary refill takes less than 2 seconds.     Coloration: Skin is not pale.     Findings: No abrasion, erythema, petechiae or rash. Rash is not papular, urticarial or vesicular.     Comments: No eczematous or urticarial lesions noted.   Neurological:     Mental Status: She is alert.  Psychiatric:        Behavior: Behavior is cooperative.     Diagnostic studies:    Spirometry: results abnormal (FEV1: 0.97/52%, FVC: 1.42/59%, FEV1/FVC: 68%).    Spirometry consistent with possible restrictive disease. Xopenex four puffs via MDI treatment given in clinic with improvement in FVC, but not significant per ATS criteria.  Allergy Studies: labs sent instead          , MD [facility]

## 2021-03-25 NOTE — Patient Instructions (Addendum)
1. Cough - Your lung testing was in the 50% range and it improved slightly with the Xopenex treatment.  - Spacer sample and demonstration provided. - Start the prednisone burst provided today. - Daily controller medication(s): Breztri two puffs twice daily WITH SPACER - Prior to physical activity: ProAir 2 puffs 10-15 minutes before physical activity. - Rescue medications: ProAir 4 puffs every 4-6 hours as needed - Asthma control goals:  * Full participation in all desired activities (may need albuterol before activity) * Albuterol use two time or less a week on average (not counting use with activity) * Cough interfering with sleep two time or less a month * Oral steroids no more than once a year * No hospitalizations  2. Chronic rhinitis - We are going to get some labs to look for environmental allergies since your lung testing was not great. - Sometimes testing can make lung function worse, so I did not want to take chances with you.   3. Return in about 2 weeks (around 04/08/2021).    Please inform us of any Emergency Department visits, hospitalizations, or changes in symptoms. Call us before going to the ED for breathing or allergy symptoms since we might be able to fit you in for a sick visit. Feel free to contact us anytime with any questions, problems, or concerns.  It was a pleasure to meet you today! You are adorable!   Websites that have reliable patient information: 1. American Academy of Asthma, Allergy, and Immunology: www.aaaai.org 2. Food Allergy Research and Education (FARE): foodallergy.org 3. Mothers of Asthmatics: http://www.asthmacommunitynetwork.org 4. American College of Allergy, Asthma, and Immunology: www.acaai.org   COVID-19 Vaccine Information can be found at: [email or web address] For questions related to vaccine distribution or appointments, please email [email or web address] or call [phone].    We realize that you might be concerned about having an allergic reaction to the COVID19 vaccines. To help with that concern, WE ARE OFFERING THE COVID19 VACCINES IN OUR OFFICE! Ask the front desk for dates!     "Like" [place] on Facebook and Instagram for our latest updates!      A healthy democracy works best when [employer] participate! Make sure you are registered to vote! If you have moved or changed any of your contact information, you will need to get this updated before voting!  In some cases, you MAY be able to register to vote online: [email or web address]

## 2021-04-08 ENCOUNTER — Ambulatory Visit: Payer: Medicare HMO | Admitting: Allergy & Immunology

## 2021-04-08 ENCOUNTER — Encounter: Payer: Self-pay | Admitting: Allergy & Immunology

## 2021-04-08 ENCOUNTER — Other Ambulatory Visit: Payer: Self-pay

## 2021-04-08 ENCOUNTER — Ambulatory Visit (INDEPENDENT_AMBULATORY_CARE_PROVIDER_SITE_OTHER): Payer: Medicare HMO | Admitting: Allergy & Immunology

## 2021-04-08 VITALS — BP 130/62 | HR 71 | Temp 98.1°F | Resp 14 | Ht 64.0 in | Wt 150.0 lb

## 2021-04-08 DIAGNOSIS — R059 Cough, unspecified: Secondary | ICD-10-CM | POA: Diagnosis not present

## 2021-04-08 DIAGNOSIS — R053 Chronic cough: Secondary | ICD-10-CM | POA: Diagnosis not present

## 2021-04-08 DIAGNOSIS — J310 Chronic rhinitis: Secondary | ICD-10-CM

## 2021-04-08 DIAGNOSIS — J984 Other disorders of lung: Secondary | ICD-10-CM

## 2021-04-08 NOTE — Progress Notes (Signed)
FOLLOW UP  Date of Service/Encounter:  04/08/21   Assessment:   Chronic rhinitis    Cough   Restrictive lung disease - but essentially refusing medications  Plan/Recommendations:   1. Cough - Your lung testing was 63% today - MUCH BETTER - I am fine with you staying off of your inhalers for now. - I would like you to get those labs that I ordered last time. - New lab order sheet provided.  - Daily controller medication(s): NOTHING  - Prior to physical activity: ProAir 2 puffs 10-15 minutes before physical activity. - Rescue medications: ProAir 4 puffs every 4-6 hours as needed - Asthma control goals:  * Full participation in all desired activities (may need albuterol before activity) * Albuterol use two time or less a week on average (not counting use with activity) * Cough interfering with sleep two time or less a month * Oral steroids no more than once a year * No hospitalizations  2. Chronic rhinitis - We need you to get those labs. - We will call you in 1-2 weeks with the results of the testing.   3. Return if symptoms worsen or fail to improve.      Subjective:   Kristen Gallagher is a 70 y.o. female presenting today for follow up of  Chief Complaint  Patient presents with   Cough    Kristen Gallagher has a history of the following: Patient Active Problem List   Diagnosis Date Noted   GERD (gastroesophageal reflux disease) 07/31/2017   Gastroesophageal reflux disease without esophagitis 07/31/2017   Cough 07/31/2017   HYPERCHOLESTEROLEMIA  IIA 04/06/2009   Essential hypertension 04/06/2009   ABNORMAL CV (STRESS) TEST 04/06/2009    History obtained from: chart review and patient.  Kristen Gallagher is a 70 y.o. female presenting for a follow up visit.  She was last seen in September 2022 as a new patient.  Her lung function was in the 50% range and improved slightly when we gave her Xopenex.  We decided to continue her on the Breztri 2 puffs twice daily and  restart her on a prednisone burst to see if there is any reversibility.  We did not do any testing because of her lung function and instead ordered several labs for both environmental allergy testing and to rule out serious causes of asthma.  Since last visit, she reports that she has done well.  Asthma/Respiratory Symptom History: She is no longer on the [place]. It was $100. She never felt that it helped her breathing at all.  She has an emergency inhaler that she is using very rarely. She does not really think that it helps. She is coughing rarely.  She thinks overall she is getting better.  She has not gotten her labs done. She is planning to do them when she gets her annual labs.  However, when talking with her more, does not seem to want to get her labs done at all.  Overall, she does not like to take medications.  She is not convinced that she needs them or gets any benefit from them.  She tells me today she hopes this is her last visit with me.  Otherwise, there have been no changes to her past medical history, surgical history, family history, or social history.    Review of Systems  Constitutional: Negative.  Negative for fever, malaise/fatigue and weight loss.  HENT: Negative.  Negative for congestion, ear discharge and ear pain.   Eyes:  Negative  for pain, discharge and redness.  Respiratory:  Positive for cough. Negative for sputum production, shortness of breath and wheezing.   Cardiovascular: Negative.  Negative for chest pain and palpitations.  Gastrointestinal:  Negative for abdominal pain, constipation, diarrhea, heartburn, nausea and vomiting.  Skin: Negative.  Negative for itching and rash.  Neurological:  Negative for dizziness and headaches.  Endo/Heme/Allergies:  Negative for environmental allergies. Does not bruise/bleed easily.      Objective:   Blood pressure 130/62, pulse 71, temperature 98.1 F (36.7 C), temperature source Temporal, resp. rate 14, height 5\' 4"   (1.626 m), weight 150 lb (68 kg), SpO2 95 %. Body mass index is 25.75 kg/m.   Physical Exam:  Physical Exam Vitals reviewed.  Constitutional:      Appearance: She is well-developed.     Comments: Talkative.  Seems less tachypneic today.  HENT:     Head: Normocephalic and atraumatic.     Right Ear: Tympanic membrane, ear canal and external ear normal.     Left Ear: Tympanic membrane, ear canal and external ear normal.     Nose: No nasal deformity, septal deviation, mucosal edema or rhinorrhea.     Right Turbinates: Not enlarged or swollen.     Left Turbinates: Not enlarged or swollen.     Right Sinus: No maxillary sinus tenderness or frontal sinus tenderness.     Left Sinus: No maxillary sinus tenderness or frontal sinus tenderness.     Mouth/Throat:     Mouth: Mucous membranes are not pale and not dry.     Pharynx: Uvula midline.  Eyes:     General: Lids are normal. No allergic shiner.       Right eye: No discharge.        Left eye: No discharge.     Conjunctiva/sclera: Conjunctivae normal.     Right eye: Right conjunctiva is not injected. No chemosis.    Left eye: Left conjunctiva is not injected. No chemosis.    Pupils: Pupils are equal, round, and reactive to light.  Cardiovascular:     Rate and Rhythm: Normal rate and regular rhythm.     Heart sounds: Normal heart sounds.  Pulmonary:     Effort: Pulmonary effort is normal. No tachypnea, accessory muscle usage or respiratory distress.     Breath sounds: Normal breath sounds. No wheezing, rhonchi or rales.     Comments: No wheezing or crackles.  Somewhat decreased air movement at the bases.  Some coarse upper airway sounds. Chest:     Chest wall: No tenderness.  Lymphadenopathy:     Cervical: No cervical adenopathy.  Skin:    Coloration: Skin is not pale.     Findings: No abrasion, erythema, petechiae or rash. Rash is not papular, urticarial or vesicular.  Neurological:     Mental Status: She is alert.  Psychiatric:         Behavior: Behavior is cooperative.     Diagnostic studies:   Spirometry: results abnormal (FEV1: 1.18/63%, FVC: 2.14/89%, FEV1/FVC: 55%).    Spirometry consistent with moderate obstructive disease.   Allergy Studies: none        11-01-2002, MD  [facility]

## 2021-04-08 NOTE — Patient Instructions (Addendum)
1. Cough - Your lung testing was 63% today - MUCH BETTER - I am fine with you staying off of your inhalers for now. - I would like you to get those labs that I ordered last time. - New lab order sheet provided.  - Daily controller medication(s): NOTHING  - Prior to physical activity: ProAir 2 puffs 10-15 minutes before physical activity. - Rescue medications: ProAir 4 puffs every 4-6 hours as needed - Asthma control goals:  * Full participation in all desired activities (may need albuterol before activity) * Albuterol use two time or less a week on average (not counting use with activity) * Cough interfering with sleep two time or less a month * Oral steroids no more than once a year * No hospitalizations  2. Chronic rhinitis - We need you to get those labs. - We will call you in 1-2 weeks with the results of the testing.   3. Return if symptoms worsen or fail to improve.    Please inform us of any Emergency Department visits, hospitalizations, or changes in symptoms. Call us before going to the ED for breathing or allergy symptoms since we might be able to fit you in for a sick visit. Feel free to contact us anytime with any questions, problems, or concerns.  It was a pleasure to see you again today! You are adorable!   Websites that have reliable patient information: 1. American Academy of Asthma, Allergy, and Immunology: www.aaaai.org 2. Food Allergy Research and Education (FARE): foodallergy.org 3. Mothers of Asthmatics: http://www.asthmacommunitynetwork.org 4. American College of Allergy, Asthma, and Immunology: www.acaai.org   COVID-19 Vaccine Information can be found at: [email or web address].nl For questions related to vaccine distribution or appointments, please email [email or web address] or call [phone].   We realize that you might be concerned about having an allergic reaction to the COVID19 vaccines. To help  with that concern, WE ARE OFFERING THE COVID19 VACCINES IN OUR OFFICE! Ask the front desk for dates!     "Like" [place] on Facebook and Instagram for our latest updates!      A healthy democracy works best when [employer] participate! Make sure you are registered to vote! If you have moved or changed any of your contact information, you will need to get this updated before voting!  In some cases, you MAY be able to register to vote online: [email or web address]

## 2021-04-10 ENCOUNTER — Encounter: Payer: Self-pay | Admitting: Allergy & Immunology

## 2021-04-14 ENCOUNTER — Institutional Professional Consult (permissible substitution): Payer: Medicare HMO | Admitting: Pulmonary Disease

## 2021-04-14 LAB — ALLERGENS W/COMP RFLX AREA 2
Alternaria Alternata IgE: <0.1 kU/L
Aspergillus Fumigatus IgE: <0.1 kU/L
Bermuda Grass IgE: <0.1 kU/L
Cedar, Mountain IgE: <0.1 kU/L
Cladosporium Herbarum IgE: <0.1 kU/L
Cockroach, German IgE: <0.1 kU/L
Common Silver Birch IgE: <0.1 kU/L
Cottonwood IgE: <0.1 kU/L
D Farinae IgE: <0.1 kU/L
D Pteronyssinus IgE: <0.1 kU/L
E001-IgE Cat Dander: <0.1 kU/L
E005-IgE Dog Dander: <0.1 kU/L
Elm, American IgE: <0.1 kU/L
IgE (Immunoglobulin E), Serum: 19 IU/mL (ref 6–495)
Johnson Grass IgE: <0.1 kU/L
Maple/Box Elder IgE: <0.1 kU/L
Mouse Urine IgE: <0.1 kU/L
Oak, White IgE: <0.1 kU/L
Pecan, Hickory IgE: <0.1 kU/L
Penicillium Chrysogen IgE: <0.1 kU/L
Pigweed, Rough IgE: <0.1 kU/L
Ragweed, Short IgE: <0.1 kU/L
Sheep Sorrel IgE Qn: <0.1 kU/L
Timothy Grass IgE: <0.1 kU/L
White Mulberry IgE: <0.1 kU/L

## 2021-04-14 LAB — CBC WITH DIFFERENTIAL/PLATELET
Basophils Absolute: 0.1 10*3/uL (ref 0.0–0.2)
Basos: 1 %
EOS (ABSOLUTE): 0.1 10*3/uL (ref 0.0–0.4)
Eos: 3 %
Hematocrit: 32 % — ABNORMAL LOW (ref 34.0–46.6)
Hemoglobin: 10.2 g/dL — ABNORMAL LOW (ref 11.1–15.9)
Immature Grans (Abs): 0 10*3/uL (ref 0.0–0.1)
Immature Granulocytes: 0 %
Lymphocytes Absolute: 0.8 10*3/uL (ref 0.7–3.1)
Lymphs: 23 %
MCH: 23.3 pg — ABNORMAL LOW (ref 26.6–33.0)
MCHC: 31.9 g/dL (ref 31.5–35.7)
MCV: 73 fL — ABNORMAL LOW (ref 79–97)
Monocytes Absolute: 0.4 10*3/uL (ref 0.1–0.9)
Monocytes: 11 %
Neutrophils Absolute: 2.2 10*3/uL (ref 1.4–7.0)
Neutrophils: 62 %
Platelets: 260 10*3/uL (ref 150–450)
RBC: 4.38 x10E6/uL (ref 3.77–5.28)
RDW: 14.7 % (ref 11.7–15.4)
WBC: 3.6 10*3/uL (ref 3.4–10.8)

## 2021-04-14 LAB — ASPERGILLUS PRECIPITINS
A.Fumigatus #1 Abs: NEGATIVE
Aspergillus Flavus Antibodies: NEGATIVE
Aspergillus Niger Antibodies: NEGATIVE
Aspergillus glaucus IgG: NEGATIVE
Aspergillus nidulans IgG: NEGATIVE
Aspergillus terreus IgG: NEGATIVE

## 2021-04-14 LAB — ANCA TITERS
Atypical pANCA: <1:20 {titer}
C-ANCA: <1:20 {titer}
P-ANCA: <1:20 {titer}

## 2021-04-14 LAB — ALPHA-1-ANTITRYPSIN: A-1 Antitrypsin: 190 mg/dL — ABNORMAL HIGH (ref 101–187)

## 2021-04-22 ENCOUNTER — Telehealth: Payer: Self-pay

## 2021-04-22 NOTE — Telephone Encounter (Signed)
Patient called and was informed of her lab results. Patient expressed that she would stay on her current treatment plan and follow up in January but will come sooner if need be.

## 2021-07-15 ENCOUNTER — Ambulatory Visit: Payer: Medicare HMO | Admitting: Allergy & Immunology
